# Patient Record
Sex: Female | Born: 1960
Health system: Southern US, Community
[De-identification: ages and names within clinical notes are randomized; demographics above are authoritative.]

## PROBLEM LIST (undated history)

## (undated) DIAGNOSIS — Z9289 Personal history of other medical treatment: Secondary | ICD-10-CM

## (undated) DIAGNOSIS — E785 Hyperlipidemia, unspecified: Secondary | ICD-10-CM

## (undated) DIAGNOSIS — R7303 Prediabetes: Secondary | ICD-10-CM

## (undated) DIAGNOSIS — R87619 Unspecified abnormal cytological findings in specimens from cervix uteri: Secondary | ICD-10-CM

## (undated) DIAGNOSIS — A6000 Herpesviral infection of urogenital system, unspecified: Secondary | ICD-10-CM

## (undated) DIAGNOSIS — I10 Essential (primary) hypertension: Secondary | ICD-10-CM

## (undated) DIAGNOSIS — R7301 Impaired fasting glucose: Secondary | ICD-10-CM

## (undated) HISTORY — DX: Prediabetes: R73.03

## (undated) HISTORY — DX: Unspecified abnormal cytological findings in specimens from cervix uteri: R87.619

## (undated) HISTORY — PX: DILATION AND CURETTAGE OF UTERUS: SHX78

## (undated) HISTORY — DX: Impaired fasting glucose: R73.01

## (undated) HISTORY — DX: Essential (primary) hypertension: I10

## (undated) HISTORY — DX: Herpesviral infection of urogenital system, unspecified: A60.00

## (undated) HISTORY — DX: Personal history of other medical treatment: Z92.89

## (undated) HISTORY — DX: Hyperlipidemia, unspecified: E78.5

---

## 2004-02-26 ENCOUNTER — Ambulatory Visit: Payer: Self-pay | Admitting: Unknown Physician Specialty

## 2004-08-06 ENCOUNTER — Ambulatory Visit: Payer: Self-pay | Admitting: Gynecologic Oncology

## 2005-03-03 ENCOUNTER — Ambulatory Visit: Payer: Self-pay | Admitting: Unknown Physician Specialty

## 2006-04-09 ENCOUNTER — Ambulatory Visit: Payer: Self-pay | Admitting: Unknown Physician Specialty

## 2007-04-28 ENCOUNTER — Ambulatory Visit: Payer: Self-pay | Admitting: Unknown Physician Specialty

## 2007-05-04 ENCOUNTER — Ambulatory Visit: Payer: Self-pay | Admitting: Unknown Physician Specialty

## 2007-11-16 ENCOUNTER — Ambulatory Visit: Payer: Self-pay | Admitting: Unknown Physician Specialty

## 2008-10-18 ENCOUNTER — Ambulatory Visit: Payer: Self-pay | Admitting: Obstetrics and Gynecology

## 2009-10-23 ENCOUNTER — Ambulatory Visit: Payer: Self-pay | Admitting: Internal Medicine

## 2011-02-19 ENCOUNTER — Telehealth: Payer: Self-pay | Admitting: Internal Medicine

## 2011-02-19 NOTE — Telephone Encounter (Signed)
No, she can just schedule

## 2011-02-19 NOTE — Telephone Encounter (Signed)
Pt received letter from [facility] saying its time for her mammogram does she need an appointment here prior to ordering her mammogram [phone]

## 2011-02-19 NOTE — Telephone Encounter (Signed)
Left message for pt to call office

## 2011-02-19 NOTE — Telephone Encounter (Signed)
Spoke to pt she is going to make her appointment at [facility]

## 2011-03-31 ENCOUNTER — Ambulatory Visit: Payer: Self-pay | Admitting: Internal Medicine

## 2011-06-19 ENCOUNTER — Encounter: Payer: Self-pay | Admitting: Internal Medicine

## 2011-06-24 ENCOUNTER — Encounter: Payer: Self-pay | Admitting: Internal Medicine

## 2011-06-24 ENCOUNTER — Ambulatory Visit (INDEPENDENT_AMBULATORY_CARE_PROVIDER_SITE_OTHER): Payer: PRIVATE HEALTH INSURANCE | Admitting: Internal Medicine

## 2011-06-24 ENCOUNTER — Other Ambulatory Visit (HOSPITAL_COMMUNITY)
Admission: RE | Admit: 2011-06-24 | Discharge: 2011-06-24 | Disposition: A | Discharge status: Home / Self Care | Payer: PRIVATE HEALTH INSURANCE | Source: Ambulatory Visit | Attending: Internal Medicine | Admitting: Internal Medicine

## 2011-06-24 VITALS — BP 136/82 | HR 95 | Temp 98.4°F | Ht 65.5 in | Wt 181.0 lb

## 2011-06-24 DIAGNOSIS — Z0000 Encounter for general adult medical examination without abnormal findings: Secondary | ICD-10-CM

## 2011-06-24 DIAGNOSIS — Z124 Encounter for screening for malignant neoplasm of cervix: Secondary | ICD-10-CM

## 2011-06-24 DIAGNOSIS — Z01419 Encounter for gynecological examination (general) (routine) without abnormal findings: Secondary | ICD-10-CM | POA: Insufficient documentation

## 2011-06-24 DIAGNOSIS — J3489 Other specified disorders of nose and nasal sinuses: Secondary | ICD-10-CM

## 2011-06-24 DIAGNOSIS — Z1159 Encounter for screening for other viral diseases: Secondary | ICD-10-CM | POA: Insufficient documentation

## 2011-06-24 DIAGNOSIS — Z1211 Encounter for screening for malignant neoplasm of colon: Secondary | ICD-10-CM

## 2011-06-24 DIAGNOSIS — R0981 Nasal congestion: Secondary | ICD-10-CM

## 2011-06-24 DIAGNOSIS — R413 Other amnesia: Secondary | ICD-10-CM

## 2011-06-24 DIAGNOSIS — R41840 Attention and concentration deficit: Secondary | ICD-10-CM

## 2011-06-24 LAB — COMPREHENSIVE METABOLIC PANEL
AST: 21 U/L (ref 0–37)
Albumin: 4.1 g/dL (ref 3.5–5.2)
Alkaline Phosphatase: 45 U/L (ref 39–117)
BUN: 13 mg/dL (ref 6–23)
Calcium: 9.1 mg/dL (ref 8.4–10.5)
Chloride: 103 mEq/L (ref 96–112)
Glucose, Bld: 97 mg/dL (ref 70–99)
Potassium: 3.7 mEq/L (ref 3.5–5.1)
Sodium: 139 mEq/L (ref 135–145)
Total Protein: 7.3 g/dL (ref 6.0–8.3)

## 2011-06-24 LAB — FOLLICLE STIMULATING HORMONE: FSH: 15.9 m[IU]/mL

## 2011-06-24 LAB — LIPID PANEL
Cholesterol: 248 mg/dL — ABNORMAL HIGH (ref 0–200)
Total CHOL/HDL Ratio: 6

## 2011-06-24 LAB — TSH: TSH: 1.39 u[IU]/mL (ref 0.35–5.50)

## 2011-06-24 LAB — LUTEINIZING HORMONE: LH: 10.95 m[IU]/mL

## 2011-06-24 MED ORDER — GUAIFENESIN ER 600 MG PO TB12
1200.0000 mg | ORAL_TABLET | Freq: Two times a day (BID) | ORAL | Status: DC
Start: 2011-06-24 — End: 2011-06-26

## 2011-06-24 NOTE — Progress Notes (Signed)
Subjective:    Patient ID: Brittney Park, female    DOB: 1961-04-19, 51 y.o.   MRN: [id number]  HPI 51 year old female presents for her annual exam. She reports that she is generally doing well. She is up-to-date on her health maintenance with the exception of her colonoscopy. She would like to schedule that today. She is also due for Pap. She is up-to-date on mammogram. She reports healthy diet and regular physical activity. She is not a smoker. She is up-to-date on her vaccinations.  She is concerned today about some recent difficulty with concentration. She notes that she sometimes has trouble focusing. She is interested in being evaluated for ADD. She was never diagnosed with this as a child. She did well in school. She notes, however, that she always had trouble focusing. She has never taken medication for this.  Outpatient Encounter Prescriptions as of 06/24/2011  Medication Sig Dispense Refill  . cholecalciferol (VITAMIN D) 1000 UNITS tablet Take 1,000 Units by mouth daily.      . fish oil-omega-3 fatty acids 1000 MG capsule Take 2 g by mouth daily.      . Multiple Vitamins-Minerals (MULTIVITAMIN WITH MINERALS) tablet Take 1 tablet by mouth daily.      . norethindrone-ethinyl estradiol 1/35 (ORTHO-NOVUM, NORTREL,CYCLAFEM) tablet Take 1 tablet by mouth daily.      Marland Kitchen guaiFENesin (MUCINEX) 600 MG 12 hr tablet Take 2 tablets (1,200 mg total) by mouth 2 (two) times daily.  30 tablet  0    Review of Systems  Constitutional: Negative for fever, chills, appetite change, fatigue and unexpected weight change.  HENT: Negative for ear pain, congestion, sore throat, trouble swallowing, neck pain, voice change and sinus pressure.   Eyes: Negative for visual disturbance.  Respiratory: Negative for cough, shortness of breath, wheezing and stridor.   Cardiovascular: Negative for chest pain, palpitations and leg swelling.  Gastrointestinal: Negative for nausea, vomiting, abdominal pain, diarrhea,  constipation, blood in stool, abdominal distention and anal bleeding.  Genitourinary: Negative for dysuria and flank pain.  Musculoskeletal: Negative for myalgias, arthralgias and gait problem.  Skin: Negative for color change and rash.  Neurological: Negative for dizziness and headaches.  Hematological: Negative for adenopathy. Does not bruise/bleed easily.  Psychiatric/Behavioral: Positive for decreased concentration. Negative for suicidal ideas, sleep disturbance and dysphoric mood. The patient is not nervous/anxious.    BP 136/82  Pulse 95  Temp(Src) 98.4 F (36.9 C) (Oral)  Ht 5' 5.5" (1.664 m)  Wt 181 lb (82.101 kg)  BMI 29.66 kg/m2  SpO2 98%     Objective:   Physical Exam  Constitutional: She is oriented to person, place, and time. She appears well-developed and well-nourished. No distress.  HENT:  Head: Normocephalic and atraumatic.  Right Ear: External ear normal.  Left Ear: External ear normal.  Nose: Nose normal.  Mouth/Throat: Oropharynx is clear and moist. No oropharyngeal exudate.  Eyes: Conjunctivae are normal. Pupils are equal, round, and reactive to light. Right eye exhibits no discharge. Left eye exhibits no discharge. No scleral icterus.  Neck: Normal range of motion. Neck supple. No tracheal deviation present. No thyromegaly present.  Cardiovascular: Normal rate, regular rhythm, normal heart sounds and intact distal pulses.  Exam reveals no gallop and no friction rub.   No murmur heard. Pulmonary/Chest: Effort normal and breath sounds normal. No respiratory distress. She has no wheezes. She has no rales. She exhibits no tenderness. Right breast exhibits no inverted nipple, no mass, no nipple discharge, no skin change and no  tenderness. Left breast exhibits no inverted nipple, no mass, no nipple discharge, no skin change and no tenderness. Breasts are symmetrical.  Abdominal: Soft. Bowel sounds are normal. She exhibits no distension and no mass. There is no  tenderness. There is no rebound and no guarding.  Genitourinary: Vagina normal and uterus normal.    No breast swelling, tenderness, discharge or bleeding. Pelvic exam was performed with patient prone. There is no rash, tenderness or lesion on the right labia. There is no rash, tenderness or lesion on the left labia. Uterus is not enlarged and not tender. Cervix exhibits no motion tenderness, no discharge and no friability. Right adnexum displays no mass, no tenderness and no fullness. Left adnexum displays no mass, no tenderness and no fullness. No erythema or tenderness around the vagina. No vaginal discharge found.  Musculoskeletal: Normal range of motion. She exhibits no edema and no tenderness.  Lymphadenopathy:    She has no cervical adenopathy.  Neurological: She is alert and oriented to person, place, and time. No cranial nerve deficit. She exhibits normal muscle tone. Coordination normal.  Skin: Skin is warm and dry. No rash noted. She is not diaphoretic. No erythema. No pallor.  Psychiatric: She has a normal mood and affect. Her behavior is normal. Judgment and thought content normal.          Assessment & Plan:

## 2011-06-24 NOTE — Assessment & Plan Note (Addendum)
Pt is UTD on health maintenance. PAP today. Colonoscopy scheduled. Labs including CBC, CMP, lipids. Follow up 1 month.

## 2011-06-24 NOTE — Assessment & Plan Note (Signed)
Pt reports trouble with concentration at work and home. Will set up evaluation for ADD with Dr. Letta Moynahan in psychology.

## 2011-06-25 ENCOUNTER — Telehealth: Payer: Self-pay | Admitting: *Deleted

## 2011-06-25 LAB — CBC WITH DIFFERENTIAL/PLATELET
Basophils Absolute: 0.1 10*3/uL (ref 0.0–0.1)
Basophils Relative: 1 % (ref 0–1)
HCT: 42.9 % (ref 36.0–46.0)
Lymphocytes Relative: 23 % (ref 12–46)
MCHC: 33.6 g/dL (ref 30.0–36.0)
Monocytes Absolute: 0.8 10*3/uL (ref 0.1–1.0)
Neutro Abs: 4.6 10*3/uL (ref 1.7–7.7)
Neutrophils Relative %: 62 % (ref 43–77)
RDW: 13 % (ref 11.5–15.5)
WBC: 7.4 10*3/uL (ref 4.0–10.5)

## 2011-06-25 LAB — LDL CHOLESTEROL, DIRECT: Direct LDL: 157.7 mg/dL

## 2011-06-25 MED ORDER — ATORVASTATIN CALCIUM 20 MG PO TABS: 20.0000 mg | ORAL_TABLET | Freq: Every day | ORAL | Status: DC

## 2011-06-25 NOTE — Telephone Encounter (Signed)
Message copied by Vernie Murders on Thu Jun 25, 2011  4:19 PM ------      Message from: Ronna Polio A      Created: Thu Jun 25, 2011 11:55 AM       Labs show that cholesterol is high. Would recommend starting Lipitor 20mg  po daily. Repeat lipids and LFTs in 50month.

## 2011-06-26 ENCOUNTER — Encounter: Payer: Self-pay | Admitting: Internal Medicine

## 2011-06-26 ENCOUNTER — Other Ambulatory Visit: Payer: Self-pay | Admitting: *Deleted

## 2011-06-26 DIAGNOSIS — R0981 Nasal congestion: Secondary | ICD-10-CM

## 2011-06-26 MED ORDER — GUAIFENESIN ER 600 MG PO TB12: 1200.0000 mg | ORAL_TABLET | Freq: Two times a day (BID) | ORAL | Status: AC

## 2011-06-29 ENCOUNTER — Encounter: Payer: Self-pay | Admitting: *Deleted

## 2011-07-30 ENCOUNTER — Encounter: Payer: Self-pay | Admitting: Internal Medicine

## 2011-07-31 ENCOUNTER — Encounter: Payer: Self-pay | Admitting: Internal Medicine

## 2011-09-22 ENCOUNTER — Ambulatory Visit: Payer: Self-pay | Admitting: Unknown Physician Specialty

## 2011-09-23 LAB — PATHOLOGY REPORT

## 2011-11-03 ENCOUNTER — Other Ambulatory Visit: Payer: Self-pay | Admitting: Internal Medicine

## 2011-11-03 MED ORDER — NORETHINDRONE-ETH ESTRADIOL 1-35 MG-MCG PO TABS: 1.0000 | ORAL_TABLET | Freq: Every day | ORAL | Status: DC

## 2011-11-03 NOTE — Telephone Encounter (Signed)
Refill [facility] fax # [phone].

## 2011-11-04 ENCOUNTER — Other Ambulatory Visit: Payer: Self-pay | Admitting: Internal Medicine

## 2011-11-04 MED ORDER — NORETHINDRONE-ETH ESTRADIOL 1-35 MG-MCG PO TABS: 1.0000 | ORAL_TABLET | Freq: Every day | ORAL | Status: DC

## 2011-11-05 NOTE — Telephone Encounter (Signed)
Faxed to Pharmacy health

## 2011-11-09 ENCOUNTER — Other Ambulatory Visit: Payer: Self-pay | Admitting: *Deleted

## 2011-11-09 MED ORDER — NORETHINDRONE-ETH ESTRADIOL 1-35 MG-MCG PO TABS: 1.0000 | ORAL_TABLET | Freq: Every day | ORAL | Status: DC

## 2011-11-18 ENCOUNTER — Encounter: Payer: Self-pay | Admitting: Internal Medicine

## 2011-11-18 ENCOUNTER — Other Ambulatory Visit: Payer: Self-pay | Admitting: *Deleted

## 2011-11-18 MED ORDER — NORETHINDRONE-ETH ESTRADIOL 1-35 MG-MCG PO TABS: 1.0000 | ORAL_TABLET | Freq: Every day | ORAL | Status: DC

## 2012-03-14 ENCOUNTER — Encounter: Payer: Self-pay | Admitting: Internal Medicine

## 2012-03-14 DIAGNOSIS — Z1239 Encounter for other screening for malignant neoplasm of breast: Secondary | ICD-10-CM

## 2012-03-15 NOTE — Telephone Encounter (Signed)
Vanessa,  pls schedule.  Order on printer

## 2012-04-05 ENCOUNTER — Ambulatory Visit: Payer: Self-pay | Admitting: Internal Medicine

## 2012-04-22 ENCOUNTER — Encounter: Payer: Self-pay | Admitting: Internal Medicine

## 2012-06-22 ENCOUNTER — Telehealth: Payer: Self-pay | Admitting: Internal Medicine

## 2012-06-22 ENCOUNTER — Encounter: Payer: Self-pay | Admitting: Internal Medicine

## 2012-06-22 ENCOUNTER — Ambulatory Visit (INDEPENDENT_AMBULATORY_CARE_PROVIDER_SITE_OTHER): Payer: BC Managed Care – PPO | Admitting: Internal Medicine

## 2012-06-22 VITALS — BP 124/80 | HR 90 | Temp 98.3°F | Ht 65.5 in | Wt 178.5 lb

## 2012-06-22 DIAGNOSIS — A084 Viral intestinal infection, unspecified: Secondary | ICD-10-CM

## 2012-06-22 DIAGNOSIS — R112 Nausea with vomiting, unspecified: Secondary | ICD-10-CM

## 2012-06-22 DIAGNOSIS — A088 Other specified intestinal infections: Secondary | ICD-10-CM

## 2012-06-22 MED ORDER — ONDANSETRON 8 MG PO TBDP: 8.0000 mg | ORAL_TABLET | Freq: Three times a day (TID) | ORAL | Status: DC | PRN

## 2012-06-22 NOTE — Assessment & Plan Note (Signed)
Symptoms seem most consistent with viral gastroenteritis. Rapid flu negative. Will treat supportively with increased clear fluids as tolerated, anti-emetics. Ondansetron 8mg  po bid prn called in.  Pt will use tylenol prn fever. Pt will call if symptoms worsening or unable to keep down liquids. She will email with update tomorrow.

## 2012-06-22 NOTE — Progress Notes (Signed)
Subjective:    Patient ID: Brittney Park, female    DOB: [birth year], 52 y.o.   MRN: [id number]  HPI 52YO female presents for acute visit. Complains of 1 day h/o subjective fever, chills, malaise, sore throat, headache, non-productive cough, nausea. Had one episode of vomiting this morning, non-bloody, non-bilious. Notes some loose stools today and yesterday. Denies abdominal pain. Denies shortness of breath or chest pain. Tolerated liquids this morning and 1 slice of toast. Has taken tylenol with no improvement in symptoms. Husband was sick with URI symptoms last week, but no nausea/vomiting.  Outpatient Encounter Prescriptions as of 06/22/2012  Medication Sig Dispense Refill  . cholecalciferol (VITAMIN D) 1000 UNITS tablet Take one by mouth once a week      . fish oil-omega-3 fatty acids 1000 MG capsule Take one by mouth twice a week      . guaiFENesin (MUCINEX) 600 MG 12 hr tablet Take 2 tablets (1,200 mg total) by mouth 2 (two) times daily.  30 tablet  0  . Multiple Vitamins-Minerals (MULTIVITAMIN WITH MINERALS) tablet Take 1 tablet by mouth daily.      . norethindrone-ethinyl estradiol 1/35 (ORTHO-NOVUM, NORTREL,CYCLAFEM) tablet Take 1 tablet by mouth daily.  1 Package  12   BP 124/80  Pulse 90  Temp 98.3 F (36.8 C) (Oral)  Ht 5' 5.5" (1.664 m)  Wt 178 lb 8 oz (80.967 kg)  BMI 29.25 kg/m2  SpO2 97%  LMP 06/14/2012  Review of Systems  Constitutional: Positive for fever, chills and fatigue. Negative for unexpected weight change.  HENT: Positive for sore throat, rhinorrhea and postnasal drip. Negative for hearing loss, ear pain, nosebleeds, congestion, facial swelling, sneezing, mouth sores, trouble swallowing, neck pain, neck stiffness, voice change, sinus pressure, tinnitus and ear discharge.   Eyes: Negative for pain, discharge, redness and visual disturbance.  Respiratory: Positive for cough. Negative for chest tightness, shortness of breath, wheezing and stridor.   Cardiovascular:  Negative for chest pain, palpitations and leg swelling.  Gastrointestinal: Positive for nausea, vomiting and diarrhea. Negative for abdominal pain, blood in stool and abdominal distention.  Musculoskeletal: Negative for myalgias and arthralgias.  Skin: Negative for color change and rash.  Neurological: Negative for dizziness, weakness, light-headedness and headaches.  Hematological: Negative for adenopathy.       Objective:   Physical Exam  Constitutional: She is oriented to person, place, and time. She appears well-developed and well-nourished. She has a sickly appearance. No distress.  HENT:  Head: Normocephalic and atraumatic.  Right Ear: External ear normal. A middle ear effusion is present.  Left Ear: External ear normal. A middle ear effusion is present.  Nose: Nose normal.  Mouth/Throat: Posterior oropharyngeal erythema present. No oropharyngeal exudate.  Eyes: Conjunctivae normal are normal. Pupils are equal, round, and reactive to light. Right eye exhibits no discharge. Left eye exhibits no discharge. No scleral icterus.  Neck: Normal range of motion and full passive range of motion without pain. Neck supple. No tracheal deviation and normal range of motion present. No thyromegaly present.  Cardiovascular: Normal rate, regular rhythm, normal heart sounds and intact distal pulses.  Exam reveals no gallop and no friction rub.   No murmur heard. Pulmonary/Chest: Effort normal and breath sounds normal. No accessory muscle usage. Not tachypneic. No respiratory distress. She has no decreased breath sounds. She has no wheezes. She has no rhonchi. She has no rales. She exhibits no tenderness.  Abdominal: Soft. Bowel sounds are normal. She exhibits no distension. There is no tenderness.  Musculoskeletal: Normal range of motion. She exhibits no edema and no tenderness.  Lymphadenopathy:    She has no cervical adenopathy.  Neurological: She is alert and oriented to person, place, and time.  No cranial nerve deficit. She exhibits normal muscle tone. Coordination normal.  Skin: Skin is warm and dry. No rash noted. She is not diaphoretic. No erythema. No pallor.  Psychiatric: She has a normal mood and affect. Her behavior is normal. Judgment and thought content normal.          Assessment & Plan:

## 2012-06-22 NOTE — Telephone Encounter (Signed)
Patient Information:  Caller Name: Hailly  Phone: [phone]  Patient: Brittney Park, Brittney Park  Gender: Female  DOB: [birth year]  Age: 52 Years  PCP: Ronna Polio (Adults only)  Pregnant: No  Office Follow Up:  Does the office need to follow up with this patient?: No  Instructions For The Office: N/A  RN Note:  Cough, sore throat, headache, nasal drainage-clear, feels nauseated has had a fever earlier, but currently does not know what it is.  Denies contact with person that tested positive for flu - husband was sick last week and several have been sick around her, but not sure if they tested or not but was thought to be the flu.  Home remedies are not working.  Does not meet standing order criteria for flu but is willing to come in.  Appointment scheduled with Dr. Dan Humphreys at 14:45- patient not able to make open 14:30 appointment.     Symptoms  Reason For Call & Symptoms: Headache, scratchy throat, fever, vomited x  Reviewed Health History In EMR: Yes  Reviewed Medications In EMR: Yes  Reviewed Allergies In EMR: Yes  Reviewed Surgeries / Procedures: Yes  Date of Onset of Symptoms: 06/21/2012  Treatments Tried: tylenol  Treatments Tried Worked: No  Any Fever: Yes  Fever Taken: Oral  Fever Time Of Reading: 10:30:00  Fever Last Reading: 101 OB / GYN:  LMP: 06/14/2012  Guideline(s) Used:  Influenza - Seasonal  Disposition Per Guideline:   Discuss with PCP and Callback by Nurse Today  Reason For Disposition Reached:   Patient requests antiviral medicine for influenza and flu symptoms present < 48 hours  Advice Given: Instructed to be careful in transit.  RN Overrode Recommendation:  Make Appointment  Patient did meet criteria for Tamiflu call in, but is willing to come in for testing.  Appointment Scheduled:  06/22/2012 14:45:00 Appointment Scheduled Provider:  Ronna Polio (Adults only)

## 2012-06-23 ENCOUNTER — Encounter: Payer: Self-pay | Admitting: Internal Medicine

## 2012-07-09 ENCOUNTER — Other Ambulatory Visit: Payer: Self-pay

## 2012-10-03 ENCOUNTER — Encounter: Payer: BC Managed Care – PPO | Admitting: Internal Medicine

## 2012-11-03 ENCOUNTER — Other Ambulatory Visit: Payer: Self-pay | Admitting: Internal Medicine

## 2013-01-05 ENCOUNTER — Ambulatory Visit (INDEPENDENT_AMBULATORY_CARE_PROVIDER_SITE_OTHER): Payer: BC Managed Care – PPO | Admitting: Internal Medicine

## 2013-01-05 ENCOUNTER — Encounter: Payer: Self-pay | Admitting: Internal Medicine

## 2013-01-05 VITALS — BP 120/80 | HR 86 | Temp 98.8°F | Ht 66.0 in | Wt 181.0 lb

## 2013-01-05 DIAGNOSIS — E663 Overweight: Secondary | ICD-10-CM | POA: Insufficient documentation

## 2013-01-05 DIAGNOSIS — Z6825 Body mass index (BMI) 25.0-25.9, adult: Secondary | ICD-10-CM

## 2013-01-05 DIAGNOSIS — E781 Pure hyperglyceridemia: Secondary | ICD-10-CM | POA: Insufficient documentation

## 2013-01-05 DIAGNOSIS — Z0000 Encounter for general adult medical examination without abnormal findings: Secondary | ICD-10-CM

## 2013-01-05 LAB — LIPID PANEL
Cholesterol: 214 mg/dL — ABNORMAL HIGH (ref 0–200)
Total CHOL/HDL Ratio: 6

## 2013-01-05 LAB — COMPREHENSIVE METABOLIC PANEL
Albumin: 3.7 g/dL (ref 3.5–5.2)
Alkaline Phosphatase: 42 U/L (ref 39–117)
BUN: 11 mg/dL (ref 6–23)
Calcium: 8.7 mg/dL (ref 8.4–10.5)
Chloride: 103 mEq/L (ref 96–112)
Glucose, Bld: 90 mg/dL (ref 70–99)
Potassium: 5.3 mEq/L — ABNORMAL HIGH (ref 3.5–5.1)
Sodium: 136 mEq/L (ref 135–145)
Total Protein: 6.4 g/dL (ref 6.0–8.3)

## 2013-01-05 LAB — CBC WITH DIFFERENTIAL/PLATELET
Eosinophils Relative: 5.9 % — ABNORMAL HIGH (ref 0.0–5.0)
HCT: 38.8 % (ref 36.0–46.0)
Hemoglobin: 13.3 g/dL (ref 12.0–15.0)
Lymphs Abs: 2.4 10*3/uL (ref 0.7–4.0)
MCV: 88.5 fl (ref 78.0–100.0)
Monocytes Absolute: 0.8 10*3/uL (ref 0.1–1.0)
Monocytes Relative: 7 % (ref 3.0–12.0)
Neutro Abs: 7 10*3/uL (ref 1.4–7.7)
RDW: 13.5 % (ref 11.5–14.6)
WBC: 10.9 10*3/uL — ABNORMAL HIGH (ref 4.5–10.5)

## 2013-01-05 LAB — LDL CHOLESTEROL, DIRECT: Direct LDL: 153 mg/dL

## 2013-01-05 LAB — MICROALBUMIN / CREATININE URINE RATIO
Creatinine,U: 180.9 mg/dL
Microalb, Ur: 0.5 mg/dL (ref 0.0–1.9)

## 2013-01-05 MED ORDER — NORETHINDRONE-ETH ESTRADIOL 1-35 MG-MCG PO TABS: ORAL_TABLET | ORAL | Status: DC

## 2013-01-05 NOTE — Assessment & Plan Note (Signed)
Encouraged healthy diet high in fiber and low in processed carbohydrates and saturated fat. Encouraged regular physical activity with goal of 40 minutes 3 times per week. 

## 2013-01-05 NOTE — Assessment & Plan Note (Addendum)
General medical exam normal today including breast exam. Pap and pelvic exam deferred as these were normal in 2013. Mammogram will be scheduled this fall. Immunizations are up-to-date. Will check labs today including CBC, CMP, lipid profile. Encouraged healthy diet high in fiber and low in processed carbohydrates and saturated fat. Encouraged regular physical activity with goal of 40 minutes 3 times per week.

## 2013-01-05 NOTE — Assessment & Plan Note (Signed)
Noted on previous labs from 2013. Pt declined use of Atorvastatin. Will repeat lipids and LFTs with labs today. Discussed new cholesterol guidelines including recommendation of Mediterranean style diet and regular aerobic activity with a goal 40 minutes 3 times per week.

## 2013-01-05 NOTE — Progress Notes (Signed)
Subjective:    Patient ID: Brittney Park, female    DOB: 1960-06-02, 52 y.o.   MRN: [id number]  HPI 51 year old female presents for annual exam. She reports she is generally feeling well. She has been trying to follow a healthier diet and get regular physical activity by walking. She is concerned about her weight. She notes that her close are fitting more tightly. At her last visit, one year ago, she was noted to have elevated cholesterol. It was recommended that she start Lipitor. She reports she discussed this with her pharmacist recommended against starting Lipitor. She decided to try to improve cholesterol with her diet alone.  Outpatient Encounter Prescriptions as of 01/05/2013  Medication Sig Dispense Refill  . cholecalciferol (VITAMIN D) 1000 UNITS tablet Take one by mouth once a week      . fish oil-omega-3 fatty acids 1000 MG capsule Take one by mouth twice a week      . Multiple Vitamins-Minerals (MULTIVITAMIN WITH MINERALS) tablet Take 1 tablet by mouth daily.      . norethindrone-ethinyl estradiol 1/35 (NORTREL 1/35, 28,) tablet TAKE ONE TABLET BY MOUTH ONE TIME DAILY  3 Package  4   No facility-administered encounter medications on file as of 01/05/2013.   BP 120/80  Pulse 86  Temp(Src) 98.8 F (37.1 C) (Oral)  Ht 5\' 6"  (1.676 m)  Wt 181 lb (82.101 kg)  BMI 29.23 kg/m2  SpO2 97%  LMP 12/29/2012  Review of Systems  Constitutional: Negative for fever, chills, appetite change, fatigue and unexpected weight change.  HENT: Negative for ear pain, congestion, sore throat, trouble swallowing, neck pain, voice change and sinus pressure.   Eyes: Negative for visual disturbance.  Respiratory: Negative for cough, shortness of breath, wheezing and stridor.   Cardiovascular: Negative for chest pain, palpitations and leg swelling.  Gastrointestinal: Negative for nausea, vomiting, abdominal pain, diarrhea, constipation, blood in stool, abdominal distention and anal bleeding.  Genitourinary:  Negative for dysuria and flank pain.  Musculoskeletal: Negative for myalgias, arthralgias and gait problem.  Skin: Negative for color change and rash.  Neurological: Negative for dizziness and headaches.  Hematological: Negative for adenopathy. Does not bruise/bleed easily.  Psychiatric/Behavioral: Negative for suicidal ideas, sleep disturbance and dysphoric mood. The patient is not nervous/anxious.        Objective:   Physical Exam  Constitutional: She is oriented to person, place, and time. She appears well-developed and well-nourished. No distress.  HENT:  Head: Normocephalic and atraumatic.  Right Ear: External ear normal.  Left Ear: External ear normal.  Nose: Nose normal.  Mouth/Throat: Oropharynx is clear and moist. No oropharyngeal exudate.  Eyes: Conjunctivae are normal. Pupils are equal, round, and reactive to light. Right eye exhibits no discharge. Left eye exhibits no discharge. No scleral icterus.  Neck: Normal range of motion. Neck supple. No tracheal deviation present. No thyromegaly present.  Cardiovascular: Normal rate, regular rhythm, normal heart sounds and intact distal pulses.  Exam reveals no gallop and no friction rub.   No murmur heard. Pulmonary/Chest: Effort normal and breath sounds normal. No accessory muscle usage. Not tachypneic. No respiratory distress. She has no decreased breath sounds. She has no wheezes. She has no rhonchi. She has no rales. She exhibits no tenderness. Right breast exhibits no inverted nipple, no mass, no nipple discharge, no skin change and no tenderness. Left breast exhibits no inverted nipple, no mass, no nipple discharge, no skin change and no tenderness. Breasts are symmetrical.  Abdominal: Soft. Bowel sounds are normal.  She exhibits no distension and no mass. There is no tenderness. There is no rebound and no guarding.  Musculoskeletal: Normal range of motion. She exhibits no edema and no tenderness.  Lymphadenopathy:    She has no  cervical adenopathy.  Neurological: She is alert and oriented to person, place, and time. No cranial nerve deficit. She exhibits normal muscle tone. Coordination normal.  Skin: Skin is warm and dry. No rash noted. She is not diaphoretic. No erythema. No pallor.  Psychiatric: She has a normal mood and affect. Her behavior is normal. Judgment and thought content normal.          Assessment & Plan:

## 2013-01-06 LAB — VITAMIN D 25 HYDROXY (VIT D DEFICIENCY, FRACTURES): Vit D, 25-Hydroxy: 38 ng/mL (ref 30–89)

## 2013-03-30 ENCOUNTER — Other Ambulatory Visit: Payer: Self-pay

## 2013-11-22 ENCOUNTER — Encounter: Payer: Self-pay | Admitting: Internal Medicine

## 2013-12-28 ENCOUNTER — Ambulatory Visit: Payer: Self-pay | Admitting: Internal Medicine

## 2013-12-28 ENCOUNTER — Encounter: Payer: Self-pay | Admitting: *Deleted

## 2013-12-28 LAB — HM MAMMOGRAPHY: HM Mammogram: NEGATIVE

## 2014-01-08 ENCOUNTER — Encounter: Payer: BC Managed Care – PPO | Admitting: Internal Medicine

## 2014-01-19 ENCOUNTER — Encounter: Payer: Self-pay | Admitting: Internal Medicine

## 2014-01-23 DIAGNOSIS — Z9289 Personal history of other medical treatment: Secondary | ICD-10-CM

## 2014-01-23 HISTORY — DX: Personal history of other medical treatment: Z92.89

## 2014-01-23 LAB — HM PAP SMEAR

## 2014-01-25 DIAGNOSIS — R7301 Impaired fasting glucose: Secondary | ICD-10-CM

## 2014-01-25 HISTORY — DX: Impaired fasting glucose: R73.01

## 2014-02-22 DIAGNOSIS — R7303 Prediabetes: Secondary | ICD-10-CM

## 2014-02-22 HISTORY — DX: Prediabetes: R73.03

## 2015-03-25 ENCOUNTER — Other Ambulatory Visit: Payer: Self-pay | Admitting: *Deleted

## 2015-03-25 ENCOUNTER — Other Ambulatory Visit: Payer: Self-pay | Admitting: Obstetrics and Gynecology

## 2015-03-25 ENCOUNTER — Inpatient Hospital Stay
Admission: RE | Admit: 2015-03-25 | Discharge: 2015-03-25 | Disposition: A | Discharge status: Home / Self Care | Payer: Self-pay | Source: Ambulatory Visit | Attending: *Deleted | Admitting: *Deleted

## 2015-03-25 DIAGNOSIS — Z9289 Personal history of other medical treatment: Secondary | ICD-10-CM

## 2015-03-25 DIAGNOSIS — R928 Other abnormal and inconclusive findings on diagnostic imaging of breast: Secondary | ICD-10-CM

## 2015-04-12 ENCOUNTER — Ambulatory Visit: Payer: Self-pay

## 2015-04-12 ENCOUNTER — Other Ambulatory Visit: Payer: Self-pay

## 2015-10-25 LAB — HM MAMMOGRAPHY: HM MAMMO: ABNORMAL — AB (ref 0–4)

## 2016-03-02 ENCOUNTER — Other Ambulatory Visit: Payer: Self-pay | Admitting: Obstetrics and Gynecology

## 2016-03-02 DIAGNOSIS — R928 Other abnormal and inconclusive findings on diagnostic imaging of breast: Secondary | ICD-10-CM

## 2016-03-02 DIAGNOSIS — N6489 Other specified disorders of breast: Secondary | ICD-10-CM

## 2016-03-05 ENCOUNTER — Inpatient Hospital Stay
Admission: RE | Admit: 2016-03-05 | Discharge: 2016-03-05 | Disposition: A | Discharge status: Home / Self Care | Payer: Self-pay | Source: Ambulatory Visit | Attending: *Deleted | Admitting: *Deleted

## 2016-03-05 ENCOUNTER — Other Ambulatory Visit: Payer: Self-pay | Admitting: *Deleted

## 2016-03-05 DIAGNOSIS — Z9289 Personal history of other medical treatment: Secondary | ICD-10-CM

## 2016-04-28 ENCOUNTER — Other Ambulatory Visit: Payer: Self-pay

## 2016-04-28 ENCOUNTER — Ambulatory Visit: Payer: Self-pay

## 2016-04-30 ENCOUNTER — Ambulatory Visit
Admission: RE | Admit: 2016-04-30 | Discharge: 2016-04-30 | Disposition: A | Discharge status: Home / Self Care | Payer: 59 | Source: Ambulatory Visit | Attending: Obstetrics and Gynecology | Admitting: Obstetrics and Gynecology

## 2016-04-30 ENCOUNTER — Encounter: Payer: Self-pay | Admitting: Radiology

## 2016-04-30 DIAGNOSIS — R928 Other abnormal and inconclusive findings on diagnostic imaging of breast: Secondary | ICD-10-CM | POA: Diagnosis not present

## 2016-04-30 DIAGNOSIS — N6489 Other specified disorders of breast: Secondary | ICD-10-CM

## 2016-09-22 DIAGNOSIS — A6000 Herpesviral infection of urogenital system, unspecified: Secondary | ICD-10-CM | POA: Insufficient documentation

## 2016-09-22 HISTORY — DX: Herpesviral infection of urogenital system, unspecified: A60.00

## 2016-09-29 ENCOUNTER — Ambulatory Visit (INDEPENDENT_AMBULATORY_CARE_PROVIDER_SITE_OTHER): Payer: 59 | Admitting: Obstetrics and Gynecology

## 2016-09-29 ENCOUNTER — Encounter: Payer: Self-pay | Admitting: Obstetrics and Gynecology

## 2016-09-29 VITALS — BP 118/74 | Ht 66.0 in | Wt 140.0 lb

## 2016-09-29 DIAGNOSIS — N898 Other specified noninflammatory disorders of vagina: Secondary | ICD-10-CM | POA: Diagnosis not present

## 2016-09-29 NOTE — Progress Notes (Signed)
Chief Complaint  Patient presents with  . Vaginitis  . Plevic Exam    pt c/o feeling irritation, itching, cluster of blisters in genital area starting 2 weeks ago    HPI:      [name] is a 56 y.o. [id number] who LMP was Patient's last menstrual period was 12/29/2012., presents today for a hx of vaginal lesions and irritation that started about 2 wks ago. Pt noticed a cluster of blisters in the vulvar area that were tender/tingling/itchy. No pain. She also noticed a day or 2 of increased d/c and odor. She denies any fever, flu-like sx, hx of HSV. Sx have resolved now. She denies any new soaps/detergents.    Patient Active Problem List   Diagnosis Date Noted  . Routine general medical examination at a health care facility 01/05/2013  . Hypertriglyceridemia 01/05/2013  . Overweight (BMI 25.0-29.9) 01/05/2013    Family History  Problem Relation Age of Onset  . Cancer Mother     ovarian?  . Cancer Father     liver  . Retinal detachment Father   . Heart disease Maternal Grandfather   . Cancer Brother     MELANOMA    Social History   Social History  . Marital status: Married    Spouse name: N/A  . Number of children: 2  . Years of education: N/A   Occupational History  . [employer] - Director [employer]   Social History Main Topics  . Smoking status: Never Smoker  . Smokeless tobacco: Never Used  . Alcohol use Yes     Comment: Occasional  . Drug use: No  . Sexual activity: Yes    Birth control/ protection: Post-menopausal   Other Topics Concern  . Not on file   Social History Narrative   Lives in [place] with husband. No pets. Work - [employer]      Regular Exercise -  NO   Daily Caffeine Use:  2 cups coffee AM              Current Outpatient Prescriptions:  .  cholecalciferol (VITAMIN D) 1000 UNITS tablet, Take one by mouth once a week, Disp: , Rfl:  .  fish oil-omega-3 fatty acids 1000 MG capsule, Take  one by mouth twice a week, Disp: , Rfl:  .  Multiple Vitamins-Minerals (MULTIVITAMIN WITH MINERALS) tablet, Take 1 tablet by mouth daily., Disp: , Rfl:   Review of Systems  Constitutional: Negative for fever.  Gastrointestinal: Negative for blood in stool, constipation, diarrhea, nausea and vomiting.  Genitourinary: Positive for genital sores and vaginal pain. Negative for dyspareunia, dysuria, flank pain, frequency, hematuria, urgency, vaginal bleeding and vaginal discharge.  Musculoskeletal: Negative for back pain.  Skin: Negative for rash.     OBJECTIVE:   Vitals:  BP 118/74   Ht 5\' 6"  (1.676 m)   Wt 140 lb (63.5 kg)   LMP 12/29/2012   BMI 22.60 kg/m   Physical Exam  Constitutional: She is oriented to person, place, and time and well-developed, well-nourished, and in no distress. Vital signs are normal.  Genitourinary: Right adnexa normal. Vulva exhibits erythema and lesion. Vulva exhibits no exudate, no rash and no tenderness.  Genitourinary Comments: RESOLVED ERYTHEMATOUS PATCH LT PERIANAL AREA  Neurological: She is oriented to person, place, and time.  Vitals reviewed.   Assessment/Plan: Vaginal lesion - Resolved. Given sx hx, will check HSV 2 IgG. If neg, question HSV 1 lesion. Discussion re: etiology,  treatment, prevention. Questions answered.  - Plan: HSV 2 antibody, IgG    Return if symptoms worsen or fail to improve.  Alicia B. Copland, PA-C 09/29/2016 10:00 AM

## 2016-09-30 ENCOUNTER — Encounter: Payer: Self-pay | Admitting: Obstetrics and Gynecology

## 2016-09-30 DIAGNOSIS — E785 Hyperlipidemia, unspecified: Secondary | ICD-10-CM | POA: Insufficient documentation

## 2016-09-30 DIAGNOSIS — I10 Essential (primary) hypertension: Secondary | ICD-10-CM | POA: Insufficient documentation

## 2016-09-30 LAB — HSV 2 ANTIBODY, IGG: HSV 2 GLYCOPROTEIN G AB, IGG: 9.67 {index} — AB (ref 0.00–0.90)

## 2017-04-07 ENCOUNTER — Ambulatory Visit: Payer: 59 | Admitting: Obstetrics and Gynecology

## 2017-05-24 ENCOUNTER — Encounter: Payer: Self-pay | Admitting: Obstetrics and Gynecology

## 2017-05-24 ENCOUNTER — Ambulatory Visit (INDEPENDENT_AMBULATORY_CARE_PROVIDER_SITE_OTHER): Payer: 59 | Admitting: Obstetrics and Gynecology

## 2017-05-24 VITALS — BP 140/90 | HR 60 | Ht 66.0 in | Wt 148.0 lb

## 2017-05-24 DIAGNOSIS — Z1151 Encounter for screening for human papillomavirus (HPV): Secondary | ICD-10-CM | POA: Diagnosis not present

## 2017-05-24 DIAGNOSIS — Z1231 Encounter for screening mammogram for malignant neoplasm of breast: Secondary | ICD-10-CM | POA: Diagnosis not present

## 2017-05-24 DIAGNOSIS — Z1239 Encounter for other screening for malignant neoplasm of breast: Secondary | ICD-10-CM

## 2017-05-24 DIAGNOSIS — Z124 Encounter for screening for malignant neoplasm of cervix: Secondary | ICD-10-CM | POA: Diagnosis not present

## 2017-05-24 DIAGNOSIS — Z01419 Encounter for gynecological examination (general) (routine) without abnormal findings: Secondary | ICD-10-CM

## 2017-05-24 DIAGNOSIS — N63 Unspecified lump in unspecified breast: Secondary | ICD-10-CM | POA: Diagnosis not present

## 2017-05-24 DIAGNOSIS — Z1211 Encounter for screening for malignant neoplasm of colon: Secondary | ICD-10-CM | POA: Diagnosis not present

## 2017-05-24 NOTE — Progress Notes (Addendum)
PCP: Patient, No Pcp Per   Chief Complaint  Patient presents with  . Gynecologic Exam    HPI:      Brittney Park is a 56 y.o. [id number] who LMP was Patient's last menstrual period was 12/29/2012., presents today for her annual examination.  Her menses are absent and she is postmenopausal. She does not have intermenstrual bleeding.  She does not have vasomotor sx.   Sex activity: single partner, contraception - post menopausal status. She does have vaginal dryness. Uses lubricants with relief.   Last Pap: January 23, 2014  Results were: no abnormalities /neg HPV DNA.  Hx of STDs: HSV lesion 5/18, neg culture but pos HSV 2 IgG. No recent sx.   Last mammogram: April 30, 2016  Results were: normal--routine follow-up in 12 months. Stable RT breast mass so due for dx mammo bilat. There is no FH of breast cancer. There is no FH of ovarian cancer. The patient does not do self-breast exams.  Colonoscopy: colonoscopy 5 years ago without abnormalities.  Repeat due after 5 years.   Tobacco use: The patient denies current or previous tobacco use. Alcohol use: none Exercise: not active  She does get adequate calcium and Vitamin D in her diet.  Lipids good last yr. LDL slightly elevated at 120. Wants lab check next yr at annual.   Past Medical History:  Diagnosis Date  . Abnormal fasting glucose 01/25/2014  . Abnormal Pap smear of cervix mid 90s   D&C  . Genital herpes 09/2016   type 2 by IgG  . History of Papanicolaou smear of cervix 01/23/2014   -/-  . HTN (hypertension)   . Hyperlipidemia   . Pre-diabetes 02/2014  . Vitamin D deficiency     Past Surgical History:  Procedure Laterality Date  . DILATION AND CURETTAGE OF UTERUS  MID 90S  . VAGINAL DELIVERY     x2    Family History  Problem Relation Age of Onset  . Cancer Mother        ovarian?  . Cancer Father        liver  . Retinal detachment Father   . Heart disease Maternal Grandfather   . Cancer Brother       MELANOMA    Social History   Socioeconomic History  . Marital status: Married    Spouse name: Not on file  . Number of children: 2  . Years of education: Not on file  . Highest education level: Not on file  Social Needs  . Financial resource strain: Not on file  . Food insecurity - worry: Not on file  . Food insecurity - inability: Not on file  . Transportation needs - medical: Not on file  . Transportation needs - non-medical: Not on file  Occupational History  . 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 Smoking status: Never Smoker  . Smokeless tobacco: Never Used  Substance and Sexual Activity  . Alcohol use: Yes    Comment: Occasional  . Drug use: No  . Sexual activity: Yes    Birth control/protection: Post-menopausal  Other Topics Concern  . Not on file  Social History Narrative   [place]. No pets. Work - [employer]      Regular Exercise -  NO   Daily Caffeine Use:  2 cups coffee AM  Current Meds  Medication Sig  . cholecalciferol (VITAMIN D) 1000 UNITS tablet Take one by mouth once a week  . Multiple Vitamins-Minerals (MULTIVITAMIN WITH MINERALS) tablet Take 1 tablet by mouth daily.      ROS:  Review of Systems  Constitutional: Negative for fatigue, fever and unexpected weight change.  Respiratory: Negative for cough, shortness of breath and wheezing.   Cardiovascular: Negative for chest pain, palpitations and leg swelling.  Gastrointestinal: Negative for blood in stool, constipation, diarrhea, nausea and vomiting.  Endocrine: Negative for cold intolerance, heat intolerance and polyuria.  Genitourinary: Negative for dyspareunia, dysuria, flank pain, frequency, genital sores, hematuria, menstrual problem, pelvic pain, urgency, vaginal bleeding, vaginal discharge and vaginal pain.  Musculoskeletal: Negative for back pain, joint swelling and  myalgias.  Skin: Negative for rash.  Neurological: Negative for dizziness, syncope, light-headedness, numbness and headaches.  Hematological: Negative for adenopathy.  Psychiatric/Behavioral: Negative for agitation, confusion, sleep disturbance and suicidal ideas. The patient is not nervous/anxious.      Objective: BP 140/90   Pulse 60   Ht 5\' 6"  (1.676 m)   Wt 148 lb (67.1 kg)   LMP 12/29/2012   BMI 23.89 kg/m    Physical Exam  Constitutional: She is oriented to person, place, and time. She appears well-developed and well-nourished.  Genitourinary: Vagina normal and uterus normal. There is no rash or tenderness on the right labia. There is no rash or tenderness on the left labia. No erythema or tenderness in the vagina. No vaginal discharge found. Right adnexum does not display mass and does not display tenderness. Left adnexum does not display mass and does not display tenderness. Cervix does not exhibit motion tenderness or polyp. Uterus is not enlarged or tender.  Neck: Normal range of motion. No thyromegaly present.  Cardiovascular: Normal rate, regular rhythm and normal heart sounds.  No murmur heard. Pulmonary/Chest: Effort normal and breath sounds normal. Right breast exhibits no mass, no nipple discharge, no skin change and no tenderness. Left breast exhibits no mass, no nipple discharge, no skin change and no tenderness.  Abdominal: Soft. There is no tenderness. There is no guarding.  Musculoskeletal: Normal range of motion.  Neurological: She is alert and oriented to person, place, and time. No cranial nerve deficit.  Psychiatric: She has a normal mood and affect. Her behavior is normal.  Vitals reviewed.   Assessment/Plan:  Encounter for annual routine gynecological examination  Cervical cancer screening - Plan: IGP, Aptima HPV  Screening for HPV (human papillomavirus) - Plan: IGP, Aptima HPV  Screening for breast cancer - Pt to sched dx mammo due to stable RT  breast mass. - Plan: MM DIAG BREAST TOMO BILATERAL  Breast mass - STable on mammo last yr - Plan: MM DIAG BREAST TOMO BILATERAL  Screening for colon cancer - Pt to sched colonoscopy. Will f/u for ref prn.    GYN counsel breast self exam, mammography screening, menopause, adequate intake of calcium and vitamin D, diet and exercise    F/U  Return in about 1 year (around 05/24/2018).  Brennan Karam B. Elica Almas, PA-C 05/24/2017 3:40 PM

## 2017-05-24 NOTE — Patient Instructions (Signed)
I value your feedback and entrusting us with your care. If you get a [facility] patient survey, I would appreciate you taking the time to let us know about your experience today. Thank you! 

## 2017-05-27 LAB — IGP, APTIMA HPV
HPV APTIMA: NEGATIVE
PAP SMEAR COMMENT: 0

## 2017-07-14 ENCOUNTER — Other Ambulatory Visit: Payer: Self-pay | Admitting: Obstetrics and Gynecology

## 2017-07-14 DIAGNOSIS — N6489 Other specified disorders of breast: Secondary | ICD-10-CM

## 2017-07-14 DIAGNOSIS — Z1239 Encounter for other screening for malignant neoplasm of breast: Secondary | ICD-10-CM

## 2017-07-27 ENCOUNTER — Encounter: Payer: Self-pay | Admitting: Obstetrics and Gynecology

## 2017-07-27 ENCOUNTER — Ambulatory Visit
Admission: RE | Admit: 2017-07-27 | Discharge: 2017-07-27 | Disposition: A | Discharge status: Home / Self Care | Payer: 59 | Source: Ambulatory Visit | Attending: Obstetrics and Gynecology | Admitting: Obstetrics and Gynecology

## 2017-07-27 DIAGNOSIS — N6310 Unspecified lump in the right breast, unspecified quadrant: Secondary | ICD-10-CM | POA: Insufficient documentation

## 2017-07-27 DIAGNOSIS — N6489 Other specified disorders of breast: Secondary | ICD-10-CM

## 2017-07-27 DIAGNOSIS — Z1239 Encounter for other screening for malignant neoplasm of breast: Secondary | ICD-10-CM

## 2017-07-27 DIAGNOSIS — N63 Unspecified lump in unspecified breast: Secondary | ICD-10-CM

## 2019-01-11 ENCOUNTER — Encounter: Payer: Self-pay | Admitting: Obstetrics and Gynecology

## 2019-01-12 ENCOUNTER — Encounter: Payer: Self-pay | Admitting: Obstetrics and Gynecology

## 2019-01-13 ENCOUNTER — Other Ambulatory Visit: Payer: Self-pay | Admitting: Obstetrics and Gynecology

## 2019-01-13 DIAGNOSIS — Z1239 Encounter for other screening for malignant neoplasm of breast: Secondary | ICD-10-CM

## 2019-02-14 ENCOUNTER — Other Ambulatory Visit: Payer: Self-pay

## 2019-02-14 ENCOUNTER — Ambulatory Visit (INDEPENDENT_AMBULATORY_CARE_PROVIDER_SITE_OTHER): Payer: BC Managed Care – PPO | Admitting: Obstetrics and Gynecology

## 2019-02-14 ENCOUNTER — Encounter: Payer: Self-pay | Admitting: Obstetrics and Gynecology

## 2019-02-14 VITALS — BP 120/80 | Ht 66.0 in | Wt 172.0 lb

## 2019-02-14 DIAGNOSIS — Z23 Encounter for immunization: Secondary | ICD-10-CM

## 2019-02-14 DIAGNOSIS — Z13220 Encounter for screening for lipoid disorders: Secondary | ICD-10-CM

## 2019-02-14 DIAGNOSIS — Z1239 Encounter for other screening for malignant neoplasm of breast: Secondary | ICD-10-CM

## 2019-02-14 DIAGNOSIS — Z01419 Encounter for gynecological examination (general) (routine) without abnormal findings: Secondary | ICD-10-CM

## 2019-02-14 DIAGNOSIS — Z1211 Encounter for screening for malignant neoplasm of colon: Secondary | ICD-10-CM

## 2019-02-14 DIAGNOSIS — Z0000 Encounter for general adult medical examination without abnormal findings: Secondary | ICD-10-CM

## 2019-02-14 NOTE — Progress Notes (Signed)
PCP: Patient, No Pcp Per   Chief Complaint  Patient presents with  . Gynecologic Exam    HPI:      [name] is a 58 y.o. [id number] who LMP was Patient's last menstrual period was 12/29/2012., presents today for her annual examination.  Her menses are absent and she is postmenopausal. She does not have intermenstrual bleeding. She does not have vasomotor sx.   Sex activity: single partner, contraception - post menopausal status. She does have vaginal dryness. Uses lubricants with relief.   Last Pap: 05/24/17  Results were: no abnormalities /neg HPV DNA.  Hx of STDs: HSV lesion 5/18, neg culture but pos HSV 2 IgG. No recent sx.   Last mammogram: 07/27/17  Results were: normal--routine follow-up in 12 months. Has 10/20 appt. There is no FH of breast cancer. There is no FH of ovarian cancer. The patient does not do self-breast exams.  Colonoscopy: colonoscopy 6 years ago without abnormalities.  Repeat due after 5 years but hasn't done it yet. Was with Dr. Vira Agar.  Tobacco use: The patient denies current or previous tobacco use. Alcohol use: occas No drug use. Exercise: not active  She does get adequate calcium but not Vitamin D in her diet.  Lipids good 2018. LDL slightly elevated at 120. Due for lab check  Past Medical History:  Diagnosis Date  . Abnormal fasting glucose 01/25/2014  . Abnormal Pap smear of cervix mid 90s   D&C  . Genital herpes 09/2016   type 2 by IgG  . History of Papanicolaou smear of cervix 01/23/2014   -/-  . HTN (hypertension)   . Hyperlipidemia   . Pre-diabetes 02/2014  . Vitamin D deficiency     Past Surgical History:  Procedure Laterality Date  . DILATION AND CURETTAGE OF UTERUS  MID 90S  . VAGINAL DELIVERY     x2    Family History  Problem Relation Age of Onset  . Cancer Mother        ovarian?  . Cancer Father        liver  . Retinal detachment Father   . Heart disease Maternal Grandfather   . Cancer Brother        MELANOMA   . Breast cancer Neg Hx     Social History   Socioeconomic History  . Marital status: Married    Spouse name: Not on file  . Number of children: 2  . Years of education: Not on file  . Highest education level: Not on file  Occupational History  . 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 Financial resource strain: Not on file  . Food insecurity    Worry: Not on file    Inability: Not on file  . Transportation needs    Medical: Not on file    Non-medical: Not on file  Tobacco Use  . Smoking status: Never Smoker  . Smokeless tobacco: Never Used  Substance and Sexual Activity  . Alcohol use: Yes    Comment: Occasional  . Drug use: No  . Sexual activity: Yes    Birth control/protection: Post-menopausal  Lifestyle  . Physical activity    Days per week: Not on file    Minutes per session: Not on file  . Stress: Not on file  Relationships  . Social Herbalist on phone: Not on file    Gets together: Not on file  Attends religious service: Not on file    Active member of club or organization: Not on file    Attends meetings of clubs or organizations: Not on file    Relationship status: Not on file  . Intimate partner violence    Fear of current or ex partner: Not on file    Emotionally abused: Not on file    Physically abused: Not on file    Forced sexual activity: Not on file  Other Topics Concern  . Not on file  Social History Narrative   Lives in [place] with husband. No pets. Work - [employer]      Regular Exercise -  NO   Daily Caffeine Use:  2 cups coffee AM             Current Meds  Medication Sig  . cholecalciferol (VITAMIN D) 1000 UNITS tablet Take one by mouth once a week  . Multiple Vitamins-Minerals (MULTIVITAMIN WITH MINERALS) tablet Take 1 tablet by mouth daily.      ROS:  Review of Systems  Constitutional: Negative for fatigue, fever and unexpected  weight change.  Respiratory: Negative for cough, shortness of breath and wheezing.   Cardiovascular: Negative for chest pain, palpitations and leg swelling.  Gastrointestinal: Negative for blood in stool, constipation, diarrhea, nausea and vomiting.  Endocrine: Negative for cold intolerance, heat intolerance and polyuria.  Genitourinary: Negative for dyspareunia, dysuria, flank pain, frequency, genital sores, hematuria, menstrual problem, pelvic pain, urgency, vaginal bleeding, vaginal discharge and vaginal pain.  Musculoskeletal: Negative for back pain, joint swelling and myalgias.  Skin: Negative for rash.  Neurological: Negative for dizziness, syncope, light-headedness, numbness and headaches.  Hematological: Negative for adenopathy.  Psychiatric/Behavioral: Negative for agitation, confusion, sleep disturbance and suicidal ideas. The patient is not nervous/anxious.      Objective: BP 120/80   Ht 5\' 6"  (1.676 m)   Wt 172 lb (78 kg)   LMP 12/29/2012   BMI 27.76 kg/m    Physical Exam Constitutional:      Appearance: She is well-developed.  Genitourinary:     Vulva, vagina, uterus, right adnexa and left adnexa normal.     No vulval lesion or tenderness noted.     No vaginal discharge, erythema or tenderness.     No cervical motion tenderness or polyp.     Uterus is not enlarged or tender.     No right or left adnexal mass present.     Right adnexa not tender.     Left adnexa not tender.  Neck:     Musculoskeletal: Normal range of motion.     Thyroid: No thyromegaly.  Cardiovascular:     Rate and Rhythm: Normal rate and regular rhythm.     Heart sounds: Normal heart sounds. No murmur.  Pulmonary:     Effort: Pulmonary effort is normal.     Breath sounds: Normal breath sounds.  Chest:     Breasts:        Right: No mass, nipple discharge, skin change or tenderness.        Left: No mass, nipple discharge, skin change or tenderness.  Abdominal:     Palpations: Abdomen is  soft.     Tenderness: There is no abdominal tenderness. There is no guarding.  Musculoskeletal: Normal range of motion.  Neurological:     General: No focal deficit present.     Mental Status: She is alert and oriented to person, place, and time.     Cranial  Nerves: No cranial nerve deficit.  Skin:    General: Skin is warm and dry.  Psychiatric:        Mood and Affect: Mood normal.        Behavior: Behavior normal.        Thought Content: Thought content normal.        Judgment: Judgment normal.  Vitals signs reviewed.     Assessment/Plan:  Encounter for annual routine gynecological examination  Screening for breast cancer--pt has mammo sched.  Screening for colon cancer - Plan: Ambulatory referral to Gastroenterology; refer to [facility]  Blood tests for routine general physical examination - Plan: Comprehensive metabolic panel, Lipid panel  Screening cholesterol level - Plan: Lipid panel    GYN counsel breast self exam, mammography screening, menopause, adequate intake of calcium and vitamin D, diet and exercise    F/U  Return in about 1 year (around 02/14/2020).   B. , PA-C 02/14/2019 4:38 PM

## 2019-02-14 NOTE — Patient Instructions (Signed)
I value your feedback and entrusting us with your care. If you get a [facility] patient survey, I would appreciate you taking the time to let us know about your experience today. Thank you! 

## 2019-02-17 ENCOUNTER — Other Ambulatory Visit: Payer: Self-pay | Admitting: Obstetrics and Gynecology

## 2019-02-17 ENCOUNTER — Encounter: Payer: Self-pay | Admitting: Obstetrics and Gynecology

## 2019-02-17 DIAGNOSIS — Z13220 Encounter for screening for lipoid disorders: Secondary | ICD-10-CM | POA: Diagnosis not present

## 2019-02-17 DIAGNOSIS — Z0000 Encounter for general adult medical examination without abnormal findings: Secondary | ICD-10-CM | POA: Diagnosis not present

## 2019-02-18 LAB — COMPREHENSIVE METABOLIC PANEL
ALT: 12 IU/L (ref 0–32)
AST: 16 IU/L (ref 0–40)
Albumin/Globulin Ratio: 2.3 — ABNORMAL HIGH (ref 1.2–2.2)
Albumin: 4.4 g/dL (ref 3.8–4.9)
Alkaline Phosphatase: 54 IU/L (ref 39–117)
BUN/Creatinine Ratio: 17 (ref 9–23)
BUN: 12 mg/dL (ref 6–24)
Bilirubin Total: 0.4 mg/dL (ref 0.0–1.2)
CO2: 24 mmol/L (ref 20–29)
Calcium: 9.3 mg/dL (ref 8.7–10.2)
Chloride: 103 mmol/L (ref 96–106)
Creatinine, Ser: 0.69 mg/dL (ref 0.57–1.00)
GFR calc Af Amer: 111 mL/min/{1.73_m2} (ref >59)
GFR calc non Af Amer: 96 mL/min/{1.73_m2} (ref >59)
Globulin, Total: 1.9 g/dL (ref 1.5–4.5)
Glucose: 103 mg/dL — ABNORMAL HIGH (ref 65–99)
Potassium: 4.4 mmol/L (ref 3.5–5.2)
Sodium: 142 mmol/L (ref 134–144)
Total Protein: 6.3 g/dL (ref 6.0–8.5)

## 2019-02-18 LAB — LIPID PANEL
Chol/HDL Ratio: 4.5 ratio — ABNORMAL HIGH (ref 0.0–4.4)
Cholesterol, Total: 209 mg/dL — ABNORMAL HIGH (ref 100–199)
HDL: 46 mg/dL (ref >39)
LDL Chol Calc (NIH): 146 mg/dL — ABNORMAL HIGH (ref 0–99)
Triglycerides: 94 mg/dL (ref 0–149)
VLDL Cholesterol Cal: 17 mg/dL (ref 5–40)

## 2019-02-21 DIAGNOSIS — L578 Other skin changes due to chronic exposure to nonionizing radiation: Secondary | ICD-10-CM | POA: Diagnosis not present

## 2019-02-21 DIAGNOSIS — L570 Actinic keratosis: Secondary | ICD-10-CM | POA: Diagnosis not present

## 2019-02-21 DIAGNOSIS — L84 Corns and callosities: Secondary | ICD-10-CM | POA: Diagnosis not present

## 2019-02-21 DIAGNOSIS — D1800 Hemangioma unspecified site: Secondary | ICD-10-CM | POA: Diagnosis not present

## 2019-03-10 ENCOUNTER — Other Ambulatory Visit: Payer: Self-pay

## 2019-03-10 DIAGNOSIS — Z20828 Contact with and (suspected) exposure to other viral communicable diseases: Secondary | ICD-10-CM | POA: Diagnosis not present

## 2019-03-10 DIAGNOSIS — Z20822 Contact with and (suspected) exposure to covid-19: Secondary | ICD-10-CM

## 2019-03-12 LAB — NOVEL CORONAVIRUS, NAA: SARS-CoV-2, NAA: NOT DETECTED

## 2019-03-17 ENCOUNTER — Ambulatory Visit
Admission: RE | Admit: 2019-03-17 | Discharge: 2019-03-17 | Disposition: A | Discharge status: Home / Self Care | Payer: BC Managed Care – PPO | Source: Ambulatory Visit | Attending: Obstetrics and Gynecology | Admitting: Obstetrics and Gynecology

## 2019-03-17 DIAGNOSIS — Z1231 Encounter for screening mammogram for malignant neoplasm of breast: Secondary | ICD-10-CM | POA: Diagnosis not present

## 2019-03-17 DIAGNOSIS — Z1239 Encounter for other screening for malignant neoplasm of breast: Secondary | ICD-10-CM

## 2019-04-03 DIAGNOSIS — Z01818 Encounter for other preprocedural examination: Secondary | ICD-10-CM | POA: Diagnosis not present

## 2019-04-03 DIAGNOSIS — Z86010 Personal history of colonic polyps: Secondary | ICD-10-CM | POA: Diagnosis not present

## 2019-06-02 ENCOUNTER — Ambulatory Visit: Payer: BC Managed Care – PPO | Attending: Internal Medicine

## 2019-06-02 DIAGNOSIS — Z20822 Contact with and (suspected) exposure to covid-19: Secondary | ICD-10-CM | POA: Diagnosis not present

## 2019-06-04 LAB — NOVEL CORONAVIRUS, NAA: SARS-CoV-2, NAA: NOT DETECTED

## 2020-02-14 NOTE — Progress Notes (Signed)
PCP: Patient, No Pcp Per   Chief Complaint  Patient presents with  . Gynecologic Exam    annual exam    HPI:      [name] is a 59 y.o. [id number] who LMP was Patient's last menstrual period was 12/29/2012., presents today for her annual examination.  Her menses are absent and she is postmenopausal. She does not have intermenstrual bleeding. She does not have vasomotor sx.   Sex activity: single partner, contraception - post menopausal status. She does have vaginal dryness. Uses lubricants with relief.   Last Pap: 05/24/17  Results were: no abnormalities /neg HPV DNA.  Hx of STDs: HSV lesion 5/18, neg culture but pos HSV 2 IgG.   Last mammogram: 03/17/19 Results were: normal--routine follow-up in 12 months.  There is no FH of breast cancer. There is no FH of ovarian cancer. The patient does not do self-breast exams.  Colonoscopy: colonoscopy 7 years ago without abnormalities.  Repeat due after 5 years but hasn't done it yet. Had appt with Dr. Marva Panda 11/20 but it was cancelled and pt never rescheduled.  Tobacco use: The patient denies current or previous tobacco use. Alcohol use: none No drug use. Exercise: not active  She does get adequate calcium and Vitamin D in her diet.  Lipids good 2018. LDL slightly elevated 9/20. Hx of pre-DM. Due for lab check  Past Medical History:  Diagnosis Date  . Abnormal fasting glucose 01/25/2014  . Abnormal Pap smear of cervix mid 90s   D&C  . Genital herpes 09/2016   type 2 by IgG  . History of Papanicolaou smear of cervix 01/23/2014   -/-  . HTN (hypertension)   . Hyperlipidemia   . Pre-diabetes 02/2014  . Vitamin D deficiency     Past Surgical History:  Procedure Laterality Date  . DILATION AND CURETTAGE OF UTERUS  MID 90S  . VAGINAL DELIVERY     x2    Family History  Problem Relation Age of Onset  . Cancer Mother        ovarian?  . Cancer Father        liver  . Retinal detachment Father   . Heart disease  Maternal Grandfather   . Cancer Brother        MELANOMA  . Breast cancer Neg Hx     Social History   Socioeconomic History  . Marital status: Married    Spouse name: Not on file  . Number of children: 2  . Years of education: Not on file  . Highest education level: Not on file  Occupational History  . 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 Smoking status: Never Smoker  . Smokeless tobacco: Never Used  Vaping Use  . Vaping Use: Never used  Substance and Sexual Activity  . Alcohol use: Yes    Comment: Occasional  . Drug use: No  . Sexual activity: Yes    Birth control/protection: Post-menopausal  Other Topics Concern  . Not on file  Social History Narrative   Lives in [place] with husband. No pets. Work - [employer]      Regular Exercise -  NO   Daily Caffeine Use:  2 cups coffee AM            Social Determinants of Corporate investment banker Strain:   . Difficulty of Paying Living Expenses: Not on file  Food Insecurity:   .  Worried About Programme researcher, broadcasting/film/video in the Last Year: Not on file  . Ran Out of Food in the Last Year: Not on file  Transportation Needs:   . Lack of Transportation (Medical): Not on file  . Lack of Transportation (Non-Medical): Not on file  Physical Activity:   . Days of Exercise per Week: Not on file  . Minutes of Exercise per Session: Not on file  Stress:   . Feeling of Stress : Not on file  Social Connections:   . Frequency of Communication with Friends and Family: Not on file  . Frequency of Social Gatherings with Friends and Family: Not on file  . Attends Religious Services: Not on file  . Active Member of Clubs or Organizations: Not on file  . Attends Banker Meetings: Not on file  . Marital Status: Not on file  Intimate Partner Violence:   . Fear of Current or Ex-Partner: Not on file  . Emotionally Abused: Not on file  . Physically  Abused: Not on file  . Sexually Abused: Not on file    Current Meds  Medication Sig  . cholecalciferol (VITAMIN D) 1000 UNITS tablet Take one by mouth once a week  . Multiple Vitamins-Minerals (MULTIVITAMIN WITH MINERALS) tablet Take 1 tablet by mouth daily.      ROS:  Review of Systems  Constitutional: Negative for fatigue, fever and unexpected weight change.  Respiratory: Negative for cough, shortness of breath and wheezing.   Cardiovascular: Negative for chest pain, palpitations and leg swelling.  Gastrointestinal: Negative for blood in stool, constipation, diarrhea, nausea and vomiting.  Endocrine: Negative for cold intolerance, heat intolerance and polyuria.  Genitourinary: Negative for dyspareunia, dysuria, flank pain, frequency, genital sores, hematuria, menstrual problem, pelvic pain, urgency, vaginal bleeding, vaginal discharge and vaginal pain.  Musculoskeletal: Negative for back pain, joint swelling and myalgias.  Skin: Negative for rash.  Neurological: Negative for dizziness, syncope, light-headedness, numbness and headaches.  Hematological: Negative for adenopathy.  Psychiatric/Behavioral: Negative for agitation, confusion, sleep disturbance and suicidal ideas. The patient is not nervous/anxious.      Objective: BP 110/72   Ht 5\' 6"  (1.676 m)   Wt 171 lb 9.6 oz (77.8 kg)   LMP 12/29/2012   BMI 27.70 kg/m    Physical Exam Constitutional:      Appearance: She is well-developed.  Genitourinary:     Vulva, vagina, uterus, right adnexa and left adnexa normal.     No vulval lesion or tenderness noted.     No vaginal discharge, erythema or tenderness.     No cervical motion tenderness or polyp.     Uterus is not enlarged or tender.     No right or left adnexal mass present.     Right adnexa not tender.     Left adnexa not tender.  Neck:     Thyroid: No thyromegaly.  Cardiovascular:     Rate and Rhythm: Normal rate and regular rhythm.     Heart sounds:  Normal heart sounds. No murmur heard.   Pulmonary:     Effort: Pulmonary effort is normal.     Breath sounds: Normal breath sounds.  Chest:     Breasts:        Right: No mass, nipple discharge, skin change or tenderness.        Left: No mass, nipple discharge, skin change or tenderness.  Abdominal:     Palpations: Abdomen is soft.     Tenderness: There is no  abdominal tenderness. There is no guarding.  Musculoskeletal:        General: Normal range of motion.     Cervical back: Normal range of motion.  Neurological:     General: No focal deficit present.     Mental Status: She is alert and oriented to person, place, and time.     Cranial Nerves: No cranial nerve deficit.  Skin:    General: Skin is warm and dry.  Psychiatric:        Mood and Affect: Mood normal.        Behavior: Behavior normal.        Thought Content: Thought content normal.        Judgment: Judgment normal.  Vitals reviewed.     Assessment/Plan:  Encounter for annual routine gynecological examination  Encounter for screening mammogram for malignant neoplasm of breast - Plan: MM 3D SCREEN BREAST BILATERAL; pt to sched mammo  Screening for colon cancer--pt to call [facility] to sched colonoscopy. F/u if needs ref from [place].  Blood tests for routine general physical examination - Plan: Comprehensive metabolic panel, Hemoglobin A1c, Lipid panel,   Screening cholesterol level - Plan: Lipid panel,   Screening for diabetes mellitus - Plan: Hemoglobin A1c,      GYN counsel breast self exam, mammography screening, menopause, adequate intake of calcium and vitamin D, diet and exercise    F/U  Return in about 1 year (around 02/14/2021).  Celestia Duva B. Raed Schalk, PA-C 02/15/2020 9:50 AM

## 2020-02-14 NOTE — Patient Instructions (Addendum)
I value your feedback and entrusting us with your care. If you get a [facility] patient survey, I would appreciate you taking the time to let us know about your experience today. Thank you! ° °As of May 04, 2019, your lab results will be released to your MyChart immediately, before I even have a chance to see them. Please give me time to review them and contact you if there are any abnormalities. Thank you for your patience.  ° °[facility]: [phone] ° ° ° °

## 2020-02-15 ENCOUNTER — Ambulatory Visit (INDEPENDENT_AMBULATORY_CARE_PROVIDER_SITE_OTHER): Payer: BC Managed Care – PPO | Admitting: Obstetrics and Gynecology

## 2020-02-15 ENCOUNTER — Encounter: Payer: Self-pay | Admitting: Obstetrics and Gynecology

## 2020-02-15 ENCOUNTER — Other Ambulatory Visit: Payer: Self-pay

## 2020-02-15 VITALS — BP 110/72 | Ht 66.0 in | Wt 171.6 lb

## 2020-02-15 DIAGNOSIS — Z0000 Encounter for general adult medical examination without abnormal findings: Secondary | ICD-10-CM

## 2020-02-15 DIAGNOSIS — Z1231 Encounter for screening mammogram for malignant neoplasm of breast: Secondary | ICD-10-CM | POA: Diagnosis not present

## 2020-02-15 DIAGNOSIS — Z01419 Encounter for gynecological examination (general) (routine) without abnormal findings: Secondary | ICD-10-CM

## 2020-02-15 DIAGNOSIS — Z13220 Encounter for screening for lipoid disorders: Secondary | ICD-10-CM

## 2020-02-15 DIAGNOSIS — Z1211 Encounter for screening for malignant neoplasm of colon: Secondary | ICD-10-CM | POA: Diagnosis not present

## 2020-02-15 DIAGNOSIS — Z131 Encounter for screening for diabetes mellitus: Secondary | ICD-10-CM

## 2020-02-21 DIAGNOSIS — Z131 Encounter for screening for diabetes mellitus: Secondary | ICD-10-CM | POA: Diagnosis not present

## 2020-02-21 DIAGNOSIS — I781 Nevus, non-neoplastic: Secondary | ICD-10-CM | POA: Diagnosis not present

## 2020-02-21 DIAGNOSIS — Z872 Personal history of diseases of the skin and subcutaneous tissue: Secondary | ICD-10-CM | POA: Diagnosis not present

## 2020-02-21 DIAGNOSIS — Z0000 Encounter for general adult medical examination without abnormal findings: Secondary | ICD-10-CM | POA: Diagnosis not present

## 2020-02-21 DIAGNOSIS — L821 Other seborrheic keratosis: Secondary | ICD-10-CM | POA: Diagnosis not present

## 2020-02-21 DIAGNOSIS — L578 Other skin changes due to chronic exposure to nonionizing radiation: Secondary | ICD-10-CM | POA: Diagnosis not present

## 2020-02-21 DIAGNOSIS — Z13220 Encounter for screening for lipoid disorders: Secondary | ICD-10-CM | POA: Diagnosis not present

## 2020-02-22 LAB — COMPREHENSIVE METABOLIC PANEL
ALT: 13 IU/L (ref 0–32)
AST: 12 IU/L (ref 0–40)
Albumin/Globulin Ratio: 2.4 — ABNORMAL HIGH (ref 1.2–2.2)
Albumin: 4.7 g/dL (ref 3.8–4.9)
Alkaline Phosphatase: 61 IU/L (ref 44–121)
BUN/Creatinine Ratio: 12 (ref 9–23)
BUN: 8 mg/dL (ref 6–24)
Bilirubin Total: 0.4 mg/dL (ref 0.0–1.2)
CO2: 23 mmol/L (ref 20–29)
Calcium: 9.2 mg/dL (ref 8.7–10.2)
Chloride: 102 mmol/L (ref 96–106)
Creatinine, Ser: 0.66 mg/dL (ref 0.57–1.00)
GFR calc Af Amer: 112 mL/min/{1.73_m2} (ref >59)
GFR calc non Af Amer: 97 mL/min/{1.73_m2} (ref >59)
Globulin, Total: 2 g/dL (ref 1.5–4.5)
Glucose: 101 mg/dL — ABNORMAL HIGH (ref 65–99)
Potassium: 4.5 mmol/L (ref 3.5–5.2)
Sodium: 141 mmol/L (ref 134–144)
Total Protein: 6.7 g/dL (ref 6.0–8.5)

## 2020-02-22 LAB — HEMOGLOBIN A1C
Est. average glucose Bld gHb Est-mCnc: 117 mg/dL
Hgb A1c MFr Bld: 5.7 % — ABNORMAL HIGH (ref 4.8–5.6)

## 2020-02-22 LAB — LIPID PANEL
Chol/HDL Ratio: 5 ratio — ABNORMAL HIGH (ref 0.0–4.4)
Cholesterol, Total: 221 mg/dL — ABNORMAL HIGH (ref 100–199)
HDL: 44 mg/dL (ref >39)
LDL Chol Calc (NIH): 151 mg/dL — ABNORMAL HIGH (ref 0–99)
Triglycerides: 143 mg/dL (ref 0–149)
VLDL Cholesterol Cal: 26 mg/dL (ref 5–40)

## 2020-05-10 ENCOUNTER — Other Ambulatory Visit: Payer: Self-pay

## 2020-05-10 ENCOUNTER — Ambulatory Visit
Admission: RE | Admit: 2020-05-10 | Discharge: 2020-05-10 | Disposition: A | Discharge status: Home / Self Care | Payer: BC Managed Care – PPO | Source: Ambulatory Visit | Attending: Obstetrics and Gynecology | Admitting: Obstetrics and Gynecology

## 2020-05-10 DIAGNOSIS — Z1231 Encounter for screening mammogram for malignant neoplasm of breast: Secondary | ICD-10-CM | POA: Diagnosis not present

## 2021-02-20 DIAGNOSIS — L578 Other skin changes due to chronic exposure to nonionizing radiation: Secondary | ICD-10-CM | POA: Diagnosis not present

## 2021-02-20 DIAGNOSIS — L738 Other specified follicular disorders: Secondary | ICD-10-CM | POA: Diagnosis not present

## 2021-02-20 DIAGNOSIS — Z872 Personal history of diseases of the skin and subcutaneous tissue: Secondary | ICD-10-CM | POA: Diagnosis not present

## 2021-03-28 ENCOUNTER — Encounter: Payer: Self-pay | Admitting: Obstetrics and Gynecology

## 2021-03-31 ENCOUNTER — Ambulatory Visit: Payer: BC Managed Care – PPO | Admitting: Obstetrics and Gynecology

## 2021-04-28 ENCOUNTER — Other Ambulatory Visit: Payer: Self-pay

## 2021-04-28 ENCOUNTER — Encounter: Payer: Self-pay | Admitting: Obstetrics and Gynecology

## 2021-04-28 ENCOUNTER — Ambulatory Visit (INDEPENDENT_AMBULATORY_CARE_PROVIDER_SITE_OTHER): Payer: BC Managed Care – PPO | Admitting: Obstetrics and Gynecology

## 2021-04-28 VITALS — BP 140/80 | Ht 66.0 in | Wt 178.0 lb

## 2021-04-28 DIAGNOSIS — Z01419 Encounter for gynecological examination (general) (routine) without abnormal findings: Secondary | ICD-10-CM | POA: Diagnosis not present

## 2021-04-28 DIAGNOSIS — Z1231 Encounter for screening mammogram for malignant neoplasm of breast: Secondary | ICD-10-CM

## 2021-04-28 DIAGNOSIS — Z23 Encounter for immunization: Secondary | ICD-10-CM

## 2021-04-28 DIAGNOSIS — E785 Hyperlipidemia, unspecified: Secondary | ICD-10-CM

## 2021-04-28 DIAGNOSIS — H9313 Tinnitus, bilateral: Secondary | ICD-10-CM | POA: Diagnosis not present

## 2021-04-28 DIAGNOSIS — R7303 Prediabetes: Secondary | ICD-10-CM | POA: Insufficient documentation

## 2021-04-28 DIAGNOSIS — Z0000 Encounter for general adult medical examination without abnormal findings: Secondary | ICD-10-CM

## 2021-04-28 DIAGNOSIS — Z131 Encounter for screening for diabetes mellitus: Secondary | ICD-10-CM

## 2021-04-28 DIAGNOSIS — M79644 Pain in right finger(s): Secondary | ICD-10-CM | POA: Diagnosis not present

## 2021-04-28 DIAGNOSIS — Z1211 Encounter for screening for malignant neoplasm of colon: Secondary | ICD-10-CM

## 2021-04-28 DIAGNOSIS — Z13220 Encounter for screening for lipoid disorders: Secondary | ICD-10-CM

## 2021-04-28 NOTE — Progress Notes (Signed)
PCP: Chad Cordial, PA-C   Chief Complaint  Patient presents with   Gynecologic Exam    Ringing ears x couple of months    HPI:      [name] is a 60 y.o. [id number] who LMP was Patient's last menstrual period was 12/29/2012., presents today for her annual examination.  Her menses are absent and she is postmenopausal. She does not have PMB. She does not have vasomotor sx.   Sex activity: single partner, contraception - post menopausal status. She does have vaginal dryness. Uses lubricants with relief.   Last Pap: 05/24/17  Results were: no abnormalities /neg HPV DNA.  Hx of STDs: HSV lesion 5/18, neg culture but pos HSV 2 IgG.   Last mammogram: 05/10/20 Results were: normal--routine follow-up in 12 months.  There is no FH of breast cancer. There is no FH of ovarian cancer. The patient does not do self-breast exams.  Having issues recently with urge incont first thing in AM. Doesn't get up to void in night. Wears pad during day in case of leakage but sx usually in early AM. Drinks coffee in AM.   Having issues with RT thumb and wrist popping/aching with certain movements, particularly rotation in wrist. No prior trauma, does repetitive use on computer. Hasn't tried ice.   Pt with tinnitus bilat ears recently. Used to have it at night only but now more frequently. No loud noise exposure.   Colonoscopy: colonoscopy 8 years ago without abnormalities.  Repeat due after 5 years but hasn't done it yet. Had appt with [facility] for 3/21 but never done.   Tobacco use: The patient denies current or previous tobacco use. Alcohol use: none No drug use. Exercise: not active  She does get adequate calcium and Vitamin D in her diet.  Lipids good 2018. LDL slightly elevated 9/20 and 9/21. Hx of pre-DM. Due for lab check  Past Medical History:  Diagnosis Date   Abnormal fasting glucose 01/25/2014   Abnormal Pap smear of cervix mid 90s   D&C   Genital herpes 09/2016   type 2 by IgG    History of Papanicolaou smear of cervix 01/23/2014   -/-   HTN (hypertension)    Hyperlipidemia    Pre-diabetes 02/2014   Vitamin D deficiency     Past Surgical History:  Procedure Laterality Date   DILATION AND CURETTAGE OF UTERUS  MID 90S   VAGINAL DELIVERY     x2    Family History  Problem Relation Age of Onset   Cancer Mother        ovarian?   Cancer Father        liver   Retinal detachment Father    Heart disease Maternal Grandfather    Cancer Brother        MELANOMA   Breast cancer Neg Hx     Social History   Socioeconomic History   Marital status: Married    Spouse name: Not on file   Number of children: 2   Years of education: Not on file   Highest education level: Not on file  Occupational History   Occupation: Loss adjuster, chartered - Marketing executive: [employer]  Tobacco Use   Smoking status: Never   Smokeless tobacco: Never  Scientific laboratory technician Use: Never used  Substance and Sexual Activity   Alcohol use: Yes    Comment: Occasional   Drug use: No   Sexual activity:  Yes    Birth control/protection: Post-menopausal  Other Topics Concern   Not on file  Social History Narrative   Lives in [place] with husband. No pets. Work - [employer]      Regular Exercise -  NO   Daily Caffeine Use:  2 cups coffee AM            Social Determinants of Radio broadcast assistant Strain: Not on file  Food Insecurity: Not on file  Transportation Needs: Not on file  Physical Activity: Not on file  Stress: Not on file  Social Connections: Not on file  Intimate Partner Violence: Not on file    Current Meds  Medication Sig   cholecalciferol (VITAMIN D) 1000 UNITS tablet Take one by mouth once a week   Multiple Vitamins-Minerals (MULTIVITAMIN WITH MINERALS) tablet Take 1 tablet by mouth daily.      ROS:  Review of Systems  Constitutional:  Negative for fatigue, fever and unexpected weight change.  HENT:   Positive for tinnitus.   Respiratory:  Negative for cough, shortness of breath and wheezing.   Cardiovascular:  Negative for chest pain, palpitations and leg swelling.  Gastrointestinal:  Negative for blood in stool, constipation, diarrhea, nausea and vomiting.  Endocrine: Negative for cold intolerance, heat intolerance and polyuria.  Genitourinary:  Positive for urgency. Negative for dyspareunia, dysuria, flank pain, frequency, genital sores, hematuria, menstrual problem, pelvic pain, vaginal bleeding, vaginal discharge and vaginal pain.  Musculoskeletal:  Positive for arthralgias. Negative for back pain, joint swelling and myalgias.  Skin:  Negative for rash.  Neurological:  Negative for dizziness, syncope, light-headedness, numbness and headaches.  Hematological:  Negative for adenopathy.  Psychiatric/Behavioral:  Negative for agitation, confusion, sleep disturbance and suicidal ideas. The patient is not nervous/anxious.     Objective: BP 140/80   Ht 5\' 6"  (1.676 m)   Wt 178 lb (80.7 kg)   LMP 12/29/2012   BMI 28.73 kg/m    Physical Exam Constitutional:      Appearance: She is well-developed.  Genitourinary:     Vulva normal.     No vaginal discharge, erythema or tenderness.      Right Adnexa: not tender and no mass present.    Left Adnexa: not tender and no mass present.    No cervical motion tenderness or polyp.     Uterus is not enlarged or tender.  Breasts:    Right: No mass, nipple discharge, skin change or tenderness.     Left: No mass, nipple discharge, skin change or tenderness.  Neck:     Thyroid: No thyromegaly.  Cardiovascular:     Rate and Rhythm: Normal rate and regular rhythm.     Heart sounds: Normal heart sounds. No murmur heard. Pulmonary:     Effort: Pulmonary effort is normal.     Breath sounds: Normal breath sounds.  Abdominal:     Palpations: Abdomen is soft.     Tenderness: There is no abdominal tenderness. There is no guarding.   Musculoskeletal:        General: Normal range of motion.     Cervical back: Normal range of motion.  Neurological:     General: No focal deficit present.     Mental Status: She is alert and oriented to person, place, and time.     Cranial Nerves: No cranial nerve deficit.  Skin:    General: Skin is warm and dry.  Psychiatric:        Mood  and Affect: Mood normal.        Behavior: Behavior normal.        Thought Content: Thought content normal.        Judgment: Judgment normal.  Vitals reviewed.    Assessment/Plan:  Encounter for annual routine gynecological examination  Encounter for screening mammogram for malignant neoplasm of breast - Plan: MM 3D SCREEN BREAST BILATERAL; pt to sched mammo  Pain of right thumb--question inflammation vs arthritis. Recommended PCP vs [facility]. Ice in meantime.   Tinnitus of both ears--refer to PCP.   Screening for colon cancer--pt to f/u with [facility] re: colonoscopy. Will do ref prn.   Blood tests for routine general physical examination - Plan: Comprehensive metabolic panel, Lipid panel, Hemoglobin A1c  Screening cholesterol level - Plan: Lipid panel  Screening for diabetes mellitus - Plan: Hemoglobin A1c  Pre-diabetes - Plan: Hemoglobin A1c  Elevated lipids - Plan: Lipid panel  Need for immunization against influenza - Plan: Flu Vaccine QUAD 26mo+IM (Fluarix, Fluzone & Alfiuria Quad PF)     GYN counsel breast self exam, mammography screening, menopause, adequate intake of calcium and vitamin D, diet and exercise    F/U  Return in about 1 year (around 04/28/2022).  Jahmeek Shirk B. Elienai Gailey, PA-C 04/28/2021 2:26 PM

## 2021-04-28 NOTE — Patient Instructions (Signed)
I value your feedback and you entrusting us with your care. If you get a  patient survey, I would appreciate you taking the time to let us know about your experience today. Thank you!  [facility]: [phone]      

## 2021-04-30 ENCOUNTER — Encounter: Payer: Self-pay | Admitting: Obstetrics and Gynecology

## 2021-04-30 DIAGNOSIS — Z1211 Encounter for screening for malignant neoplasm of colon: Secondary | ICD-10-CM

## 2021-05-07 ENCOUNTER — Other Ambulatory Visit: Payer: Self-pay

## 2021-05-07 ENCOUNTER — Other Ambulatory Visit: Payer: BC Managed Care – PPO

## 2021-05-07 DIAGNOSIS — Z0000 Encounter for general adult medical examination without abnormal findings: Secondary | ICD-10-CM

## 2021-05-07 DIAGNOSIS — Z131 Encounter for screening for diabetes mellitus: Secondary | ICD-10-CM

## 2021-05-07 DIAGNOSIS — R7303 Prediabetes: Secondary | ICD-10-CM

## 2021-05-07 DIAGNOSIS — Z13220 Encounter for screening for lipoid disorders: Secondary | ICD-10-CM

## 2021-05-07 DIAGNOSIS — E785 Hyperlipidemia, unspecified: Secondary | ICD-10-CM | POA: Diagnosis not present

## 2021-05-08 LAB — LIPID PANEL
Chol/HDL Ratio: 5.1 ratio — ABNORMAL HIGH (ref 0.0–4.4)
Cholesterol, Total: 240 mg/dL — ABNORMAL HIGH (ref 100–199)
HDL: 47 mg/dL (ref >39)
LDL Chol Calc (NIH): 175 mg/dL — ABNORMAL HIGH (ref 0–99)
Triglycerides: 101 mg/dL (ref 0–149)
VLDL Cholesterol Cal: 18 mg/dL (ref 5–40)

## 2021-05-08 LAB — COMPREHENSIVE METABOLIC PANEL
ALT: 12 IU/L (ref 0–32)
AST: 14 IU/L (ref 0–40)
Albumin/Globulin Ratio: 2 (ref 1.2–2.2)
Albumin: 4.2 g/dL (ref 3.8–4.9)
Alkaline Phosphatase: 57 IU/L (ref 44–121)
BUN/Creatinine Ratio: 18 (ref 12–28)
BUN: 14 mg/dL (ref 8–27)
Bilirubin Total: 0.3 mg/dL (ref 0.0–1.2)
CO2: 23 mmol/L (ref 20–29)
Calcium: 9.2 mg/dL (ref 8.7–10.3)
Chloride: 102 mmol/L (ref 96–106)
Creatinine, Ser: 0.78 mg/dL (ref 0.57–1.00)
Globulin, Total: 2.1 g/dL (ref 1.5–4.5)
Glucose: 97 mg/dL (ref 70–99)
Potassium: 5.3 mmol/L — ABNORMAL HIGH (ref 3.5–5.2)
Sodium: 143 mmol/L (ref 134–144)
Total Protein: 6.3 g/dL (ref 6.0–8.5)
eGFR: 87 mL/min/{1.73_m2} (ref >59)

## 2021-05-08 LAB — HEMOGLOBIN A1C
Est. average glucose Bld gHb Est-mCnc: 128 mg/dL
Hgb A1c MFr Bld: 6.1 % — ABNORMAL HIGH (ref 4.8–5.6)

## 2021-05-13 ENCOUNTER — Ambulatory Visit
Admission: RE | Admit: 2021-05-13 | Discharge: 2021-05-13 | Disposition: A | Discharge status: Home / Self Care | Payer: BC Managed Care – PPO | Source: Ambulatory Visit | Attending: Obstetrics and Gynecology | Admitting: Obstetrics and Gynecology

## 2021-05-13 ENCOUNTER — Other Ambulatory Visit: Payer: Self-pay

## 2021-05-13 DIAGNOSIS — Z1231 Encounter for screening mammogram for malignant neoplasm of breast: Secondary | ICD-10-CM | POA: Diagnosis not present

## 2021-06-11 DIAGNOSIS — M189 Osteoarthritis of first carpometacarpal joint, unspecified: Secondary | ICD-10-CM | POA: Diagnosis not present

## 2021-08-05 DIAGNOSIS — Z86010 Personal history of colonic polyps: Secondary | ICD-10-CM | POA: Diagnosis not present

## 2021-10-06 DIAGNOSIS — Z86010 Personal history of colonic polyps: Secondary | ICD-10-CM | POA: Diagnosis not present

## 2021-10-06 DIAGNOSIS — K640 First degree hemorrhoids: Secondary | ICD-10-CM | POA: Diagnosis not present

## 2021-10-06 DIAGNOSIS — Q348 Other specified congenital malformations of respiratory system: Secondary | ICD-10-CM | POA: Diagnosis not present

## 2021-10-06 DIAGNOSIS — K5730 Diverticulosis of large intestine without perforation or abscess without bleeding: Secondary | ICD-10-CM | POA: Diagnosis not present

## 2022-01-02 ENCOUNTER — Ambulatory Visit (INDEPENDENT_AMBULATORY_CARE_PROVIDER_SITE_OTHER): Payer: BC Managed Care – PPO | Admitting: Family

## 2022-01-02 ENCOUNTER — Other Ambulatory Visit (INDEPENDENT_AMBULATORY_CARE_PROVIDER_SITE_OTHER): Payer: BC Managed Care – PPO | Admitting: Radiology

## 2022-01-02 ENCOUNTER — Encounter: Payer: Self-pay | Admitting: Family

## 2022-01-02 VITALS — BP 129/82 | HR 72 | Temp 98.6°F | Resp 16 | Ht 66.0 in | Wt 172.1 lb

## 2022-01-02 DIAGNOSIS — E875 Hyperkalemia: Secondary | ICD-10-CM | POA: Diagnosis not present

## 2022-01-02 DIAGNOSIS — E781 Pure hyperglyceridemia: Secondary | ICD-10-CM | POA: Diagnosis not present

## 2022-01-02 DIAGNOSIS — R7303 Prediabetes: Secondary | ICD-10-CM | POA: Diagnosis not present

## 2022-01-02 DIAGNOSIS — R002 Palpitations: Secondary | ICD-10-CM | POA: Diagnosis not present

## 2022-01-02 DIAGNOSIS — A6004 Herpesviral vulvovaginitis: Secondary | ICD-10-CM

## 2022-01-02 DIAGNOSIS — R030 Elevated blood-pressure reading, without diagnosis of hypertension: Secondary | ICD-10-CM | POA: Insufficient documentation

## 2022-01-02 LAB — CBC WITH DIFFERENTIAL/PLATELET
Basophils Absolute: 0.1 10*3/uL (ref 0.0–0.1)
Basophils Relative: 0.7 % (ref 0.0–3.0)
Eosinophils Absolute: 0.2 10*3/uL (ref 0.0–0.7)
Eosinophils Relative: 2.6 % (ref 0.0–5.0)
HCT: 43 % (ref 36.0–46.0)
Hemoglobin: 14.3 g/dL (ref 12.0–15.0)
Lymphocytes Relative: 27.6 % (ref 12.0–46.0)
Lymphs Abs: 2.1 10*3/uL (ref 0.7–4.0)
MCHC: 33.2 g/dL (ref 30.0–36.0)
MCV: 88.1 fl (ref 78.0–100.0)
Monocytes Absolute: 0.6 10*3/uL (ref 0.1–1.0)
Monocytes Relative: 7.8 % (ref 3.0–12.0)
Neutro Abs: 4.8 10*3/uL (ref 1.4–7.7)
Neutrophils Relative %: 61.3 % (ref 43.0–77.0)
Platelets: 284 10*3/uL (ref 150.0–400.0)
RBC: 4.88 Mil/uL (ref 3.87–5.11)
RDW: 13.6 % (ref 11.5–15.5)
WBC: 7.8 10*3/uL (ref 4.0–10.5)

## 2022-01-02 LAB — COMPREHENSIVE METABOLIC PANEL
ALT: 12 U/L (ref 0–35)
AST: 15 U/L (ref 0–37)
Albumin: 4.6 g/dL (ref 3.5–5.2)
Alkaline Phosphatase: 53 U/L (ref 39–117)
BUN: 13 mg/dL (ref 6–23)
CO2: 29 mEq/L (ref 19–32)
Calcium: 9.4 mg/dL (ref 8.4–10.5)
Chloride: 103 mEq/L (ref 96–112)
Creatinine, Ser: 0.8 mg/dL (ref 0.40–1.20)
GFR: 79.48 mL/min (ref >60.00)
Glucose, Bld: 103 mg/dL — ABNORMAL HIGH (ref 70–99)
Potassium: 4.1 mEq/L (ref 3.5–5.1)
Sodium: 140 mEq/L (ref 135–145)
Total Bilirubin: 0.6 mg/dL (ref 0.2–1.2)
Total Protein: 7.1 g/dL (ref 6.0–8.3)

## 2022-01-02 LAB — LIPID PANEL
Cholesterol: 213 mg/dL — ABNORMAL HIGH (ref 0–200)
HDL: 45.1 mg/dL (ref >39.00)
LDL Cholesterol: 144 mg/dL — ABNORMAL HIGH (ref 0–99)
NonHDL: 168.32
Total CHOL/HDL Ratio: 5
Triglycerides: 122 mg/dL (ref 0.0–149.0)
VLDL: 24.4 mg/dL (ref 0.0–40.0)

## 2022-01-02 LAB — HEMOGLOBIN A1C: Hgb A1c MFr Bld: 6.2 % (ref 4.6–6.5)

## 2022-01-02 LAB — TSH: TSH: 2.8 u[IU]/mL (ref 0.35–5.50)

## 2022-01-02 NOTE — Assessment & Plan Note (Signed)
Repeat today. Pending results

## 2022-01-02 NOTE — Patient Instructions (Addendum)
Welcome to our clinic, I am happy to have you as my new patient. I am excited to continue on this healthcare journey with you.  Stop by the lab prior to leaving today. I will notify you of your results once received.   Please keep in mind Any my chart messages you send have up to a three business day turnaround for a response.  Phone calls may take up to a one full business day turnaround for a  response.   Get your second shingles vaccine.  Consider TD tetanus booster as well.   If you need a medication refill I recommend you request it through the pharmacy as this is easiest for [place] rather than sending a message and or phone call.   Due to recent changes in healthcare laws, you may see results of your imaging and/or laboratory studies on MyChart before I have had a chance to review them.  I understand that in some cases there may be results that are confusing or concerning to you. Please understand that not all results are received at the same time and often I may need to interpret multiple results in order to provide you with the best plan of care or course of treatment. Therefore, I ask that you please give me 2 business days to thoroughly review all your results before contacting my office for clarification. Should we see a critical lab result, you will be contacted sooner.   It was a pleasure seeing you today! Please do not hesitate to reach out with any questions and or concerns.  Regards,   Mort Sawyers FNP-C

## 2022-01-02 NOTE — Assessment & Plan Note (Signed)
Pt advised of the following: Work on a diabetic diet, try to incorporate exercise at least 20-30 a day for 3 days a week or more.   

## 2022-01-02 NOTE — Assessment & Plan Note (Signed)
ekg today in office, NSR.  If ongoing consider referral to cardiology.  Limit caffeine intake.  Ordering potassium repeat, and will order tsh as well to r/o reasons. Ordering cbc to r/o anemia.

## 2022-01-02 NOTE — Progress Notes (Signed)
New Patient Office Visit  Subjective:  Patient ID: Brittney Park, female    DOB: 1960/08/23  Age: 61 y.o. MRN: [id number]  CC:  Chief Complaint  Patient presents with   Establish Care    HPI Brittney Park is here to establish care as a new patient.  Prior provider was: Ronette Deter, MD [place], [place]  Pt is without acute concerns.   Pap every five years, due in 12/23 negative last pap 05/24/2017 Colonoscopy 10/06/2021, negative. Every ten years.  Shingrix: due for second. Has to make appt at [facility].  TDAP 09/30/09, overdue.   Does feel different sensations in her chest at time. Can be right or left side of chest and or under left breast. Does feel occasional palpitations. At least every other day. Does drink coffee in am.last night noticed it in bed. Rarely with heartburn. Last felt palpitations last night in bed.   Elevated potassium 8 months ago. Pt denies taking potassium.   Elevated LDL and total cholesterol. She is fasting today.  Lab Results  Component Value Date   CHOL 240 (H) 05/07/2021   HDL 47 05/07/2021   LDLCALC 175 (H) 05/07/2021   LDLDIRECT 153.0 01/05/2013   TRIG 101 05/07/2021   CHOLHDL 5.1 (H) 05/07/2021   Prediabetic . Does like cookies and ice cream.  Lab Results  Component Value Date   HGBA1C 6.1 (H) 05/07/2021   chronic concerns:  Genital herpes: only about two outbreaks a year. Controlled prn lisine.   Elevated blood pressure w/o dx htn:   Past Medical History:  Diagnosis Date   Abnormal fasting glucose 01/25/2014   Abnormal Pap smear of cervix mid 90s   D&C   Genital herpes 09/2016   type 2 by IgG   History of Papanicolaou smear of cervix 01/23/2014   -/-   HTN (hypertension)    Hyperlipidemia    Pre-diabetes 02/2014   Vitamin D deficiency     Past Surgical History:  Procedure Laterality Date   DILATION AND CURETTAGE OF UTERUS  MID 90S   VAGINAL DELIVERY     x2    Family History  Problem Relation Age  of Onset   Uterine cancer Mother    Liver cancer Father    Retinal detachment Father    Melanoma Brother    Dementia Maternal Grandmother    Heart disease Maternal Grandfather    Stroke Paternal Grandmother    Breast cancer Neg Hx     Social History   Socioeconomic History   Marital status: Married    Spouse name: Not on file   Number of children: 2   Years of education: Not on file   Highest education level: Not on file  Occupational History   Occupation: Loss adjuster, chartered - Marketing executive: [employer]  Tobacco Use   Smoking status: Never   Smokeless tobacco: Never  Scientific laboratory technician Use: Never used  Substance and Sexual Activity   Alcohol use: Yes    Comment: Occasional   Drug use: No   Sexual activity: Yes    Partners: Male    Birth control/protection: Post-menopausal  Other Topics Concern   Not on file  Social History Narrative   Lives in [place] with husband. One cat.  Work - Retail buyer Exercise -  [employer] Caffeine Use:  1 cup coffee AM   Social Determinants of Radio broadcast assistant Strain: Not on  file  Food Insecurity: Not on file  Transportation Needs: Not on file  Physical Activity: Not on file  Stress: Not on file  Social Connections: Not on file  Intimate Partner Violence: Not on file    Outpatient Medications Prior to Visit  Medication Sig Dispense Refill   Calcium Carbonate (CALCIUM 500 PO) Take by mouth.     cholecalciferol (VITAMIN D) 1000 UNITS tablet Take one by mouth once a week     Lysine 1000 MG TABS Take by mouth.     Multiple Vitamins-Minerals (MULTIVITAMIN WITH MINERALS) tablet Take 1 tablet by mouth daily.     No facility-administered medications prior to visit.    No Known Allergies     Objective:    Physical Exam Vitals reviewed.  Constitutional:      General: She is not in acute distress.    Appearance: Normal appearance. She is obese. She is not ill-appearing,  toxic-appearing or diaphoretic.  HENT:     Right Ear: Tympanic membrane normal.     Left Ear: Tympanic membrane normal.     Mouth/Throat:     Mouth: Mucous membranes are moist.     Pharynx: No pharyngeal swelling.     Tonsils: No tonsillar exudate.  Eyes:     Extraocular Movements: Extraocular movements intact.     Conjunctiva/sclera: Conjunctivae normal.     Pupils: Pupils are equal, round, and reactive to light.  Neck:     Thyroid: No thyroid mass.  Cardiovascular:     Rate and Rhythm: Normal rate and regular rhythm.  Pulmonary:     Effort: Pulmonary effort is normal.     Breath sounds: Normal breath sounds.  Abdominal:     General: Abdomen is flat. Bowel sounds are normal.     Palpations: Abdomen is soft.  Musculoskeletal:        General: Normal range of motion.  Lymphadenopathy:     Cervical:     Right cervical: No superficial cervical adenopathy.    Left cervical: No superficial cervical adenopathy.  Skin:    General: Skin is warm.     Capillary Refill: Capillary refill takes less than 2 seconds.  Neurological:     General: No focal deficit present.     Mental Status: She is alert and oriented to person, place, and time.  Psychiatric:        Mood and Affect: Mood normal.        Behavior: Behavior normal.        Thought Content: Thought content normal.        Judgment: Judgment normal.       BP 129/82   Pulse 72   Temp 98.6 F (37 C)   Resp 16   Ht 5' 6"  (1.676 m)   Wt 172 lb 2 oz (78.1 kg)   LMP 12/29/2012   SpO2 97%   BMI 27.78 kg/m  Wt Readings from Last 3 Encounters:  01/02/22 172 lb 2 oz (78.1 kg)  04/28/21 178 lb (80.7 kg)  02/15/20 171 lb 9.6 oz (77.8 kg)     Health Maintenance Due  Topic Date Due   HIV Screening  Never done   Hepatitis C Screening  Never done   TETANUS/TDAP  10/01/2019   Zoster Vaccines- Shingrix (2 of 2) 11/14/2021   INFLUENZA VACCINE  12/23/2021    There are no preventive care reminders to display for this  patient.  Lab Results  Component Value Date   TSH 3.17 01/05/2013  Lab Results  Component Value Date   WBC 10.9 (H) 01/05/2013   HGB 13.3 01/05/2013   HCT 38.8 01/05/2013   MCV 88.5 01/05/2013   PLT 362.0 01/05/2013   Lab Results  Component Value Date   NA 143 05/07/2021   K 5.3 (H) 05/07/2021   CO2 23 05/07/2021   GLUCOSE 97 05/07/2021   BUN 14 05/07/2021   CREATININE 0.78 05/07/2021   BILITOT 0.3 05/07/2021   ALKPHOS 57 05/07/2021   AST 14 05/07/2021   ALT 12 05/07/2021   PROT 6.3 05/07/2021   ALBUMIN 4.2 05/07/2021   CALCIUM 9.2 05/07/2021   EGFR 87 05/07/2021   GFR 88.80 01/05/2013   Lab Results  Component Value Date   CHOL 240 (H) 05/07/2021   Lab Results  Component Value Date   HDL 47 05/07/2021   Lab Results  Component Value Date   LDLCALC 175 (H) 05/07/2021   Lab Results  Component Value Date   TRIG 101 05/07/2021   Lab Results  Component Value Date   CHOLHDL 5.1 (H) 05/07/2021   Lab Results  Component Value Date   HGBA1C 6.1 (H) 05/07/2021      Assessment & Plan:   Problem List Items Addressed This Visit       Genitourinary   Genital herpes    Lysine prn controlled        Other   Hypertriglyceridemia    Ordered lipid panel, pending results. Work on low cholesterol diet and exercise as tolerated Consider statin if still high Handout given on low cholesterol diet      Relevant Orders   Lipid panel   Pre-diabetes    Pt advised of the following: Work on a diabetic diet, try to incorporate exercise at least 20-30 a day for 3 days a week or more.        Palpitations - Primary    ekg today in office, NSR.  If ongoing consider referral to cardiology.  Limit caffeine intake.  Ordering potassium repeat, and will order tsh as well to r/o reasons. Ordering cbc to r/o anemia.      Relevant Orders   Lipid panel   CBC with Differential/Platelet   EKG 12-Lead (Completed)   TSH   Serum potassium elevated    Repeat today. Pending  results      Relevant Orders   Comprehensive metabolic panel   EKG 95-ZXYD (Completed)   RESOLVED: Prediabetes   Relevant Orders   Hemoglobin A1c    No orders of the defined types were placed in this encounter.   Follow-up: Return in about 3 months (around 04/04/2022) for regular follow up with labs .    Eugenia Pancoast, FNP

## 2022-01-02 NOTE — Assessment & Plan Note (Signed)
Ordered lipid panel, pending results. Work on low cholesterol diet and exercise as tolerated Consider statin if still high Handout given on low cholesterol diet

## 2022-01-02 NOTE — Assessment & Plan Note (Signed)
Lysine prn controlled

## 2022-04-06 ENCOUNTER — Ambulatory Visit (INDEPENDENT_AMBULATORY_CARE_PROVIDER_SITE_OTHER): Payer: BC Managed Care – PPO | Admitting: Family

## 2022-04-06 ENCOUNTER — Encounter: Payer: Self-pay | Admitting: Family

## 2022-04-06 ENCOUNTER — Ambulatory Visit (INDEPENDENT_AMBULATORY_CARE_PROVIDER_SITE_OTHER)
Admission: RE | Admit: 2022-04-06 | Discharge: 2022-04-06 | Disposition: A | Discharge status: Home / Self Care | Payer: BC Managed Care – PPO | Source: Ambulatory Visit | Attending: Family | Admitting: Family

## 2022-04-06 VITALS — BP 126/84 | HR 76 | Temp 97.9°F | Resp 16 | Ht 66.0 in | Wt 173.5 lb

## 2022-04-06 DIAGNOSIS — R071 Chest pain on breathing: Secondary | ICD-10-CM

## 2022-04-06 DIAGNOSIS — E781 Pure hyperglyceridemia: Secondary | ICD-10-CM | POA: Diagnosis not present

## 2022-04-06 DIAGNOSIS — R002 Palpitations: Secondary | ICD-10-CM

## 2022-04-06 DIAGNOSIS — R079 Chest pain, unspecified: Secondary | ICD-10-CM | POA: Diagnosis not present

## 2022-04-06 DIAGNOSIS — R0602 Shortness of breath: Secondary | ICD-10-CM | POA: Diagnosis not present

## 2022-04-06 DIAGNOSIS — R7303 Prediabetes: Secondary | ICD-10-CM | POA: Diagnosis not present

## 2022-04-06 DIAGNOSIS — Z9189 Other specified personal risk factors, not elsewhere classified: Secondary | ICD-10-CM

## 2022-04-06 DIAGNOSIS — G8929 Other chronic pain: Secondary | ICD-10-CM

## 2022-04-06 LAB — LIPID PANEL
Cholesterol: 204 mg/dL — ABNORMAL HIGH (ref 0–200)
HDL: 45.9 mg/dL (ref >39.00)
LDL Cholesterol: 142 mg/dL — ABNORMAL HIGH (ref 0–99)
NonHDL: 158.42
Total CHOL/HDL Ratio: 4
Triglycerides: 81 mg/dL (ref 0.0–149.0)
VLDL: 16.2 mg/dL (ref 0.0–40.0)

## 2022-04-06 LAB — HEMOGLOBIN A1C: Hgb A1c MFr Bld: 6 % (ref 4.6–6.5)

## 2022-04-06 NOTE — Assessment & Plan Note (Signed)
Repeat lipid panel today pending results Continue to work on low-cholesterol diet, patient provided with handout for low-cholesterol diet Considering calcium score as well we will work with referral coordinator to see if we can get some self-pay options

## 2022-04-06 NOTE — Assessment & Plan Note (Signed)
Ordering chest x-ray to rule out pulmonary etiologies If negative will consider referral to cardiology

## 2022-04-06 NOTE — Progress Notes (Signed)
Established Patient Office Visit  Subjective:  Patient ID: Brittney Park, female    DOB: 1960-08-31  Age: 61 y.o. MRN: [id number]  CC:  Chief Complaint  Patient presents with   Palpitations    Follow up     HPI SALIHA SALTS is here today for follow up.   Pt is without acute concerns.  Palpitations; not so much palpitations as much anymore, more so can find she has difficulty taking in a deep breath and with chest tightness/heaviness at times. This sensation is not necessarily everyday. Doesn't note heartburn, states very rare if she does have this. EKG was NSR in office, considered cardiology referral last visit. She denies increasing pain or sob with exertion.   Maternal grandfather had heart disease and heart attack, unsure of age.   Wt Readings from Last 3 Encounters:  04/06/22 173 lb 8 oz (78.7 kg)  01/02/22 172 lb 2 oz (78.1 kg)  04/28/21 178 lb (80.7 kg)    Prediabetic: not so much of a change to  her diet, but at times she does try to add some vegetables. Admits to rarely exercising.  Lab Results  Component Value Date   HGBA1C 6.2 01/02/2022    Hyperlipidemia:  Lab Results  Component Value Date   CHOL 213 (H) 01/02/2022   HDL 45.10 01/02/2022   LDLCALC 144 (H) 01/02/2022   LDLDIRECT 153.0 01/05/2013   TRIG 122.0 01/02/2022   CHOLHDL 5 01/02/2022        Past Medical History:  Diagnosis Date   Abnormal fasting glucose 01/25/2014   Abnormal Pap smear of cervix mid 90s   D&C   Genital herpes 09/2016   type 2 by IgG   History of Papanicolaou smear of cervix 01/23/2014   -/-   HTN (hypertension)    Hyperlipidemia    Pre-diabetes 02/2014   Vitamin D deficiency     Past Surgical History:  Procedure Laterality Date   DILATION AND CURETTAGE OF UTERUS  MID 90S   VAGINAL DELIVERY     x2    Family History  Problem Relation Age of Onset   Uterine cancer Mother    Liver cancer Father    Retinal detachment Father    Melanoma Brother    Dementia  Maternal Grandmother    Heart disease Maternal Grandfather    Stroke Paternal Grandmother    Breast cancer Neg Hx     Social History   Socioeconomic History   Marital status: Married    Spouse name: Not on file   Number of children: 2   Years of education: Not on file   Highest education level: Not on file  Occupational History   Occupation: Loss adjuster, chartered - Marketing executive: [employer]  Tobacco Use   Smoking status: Never   Smokeless tobacco: Never  Scientific laboratory technician Use: Never used  Substance and Sexual Activity   Alcohol use: Yes    Comment: Occasional   Drug use: No   Sexual activity: Yes    Partners: Male    Birth control/protection: Post-menopausal  Other Topics Concern   Not on file  Social History Narrative   Lives in [place] with husband. One cat.  Work - Retail buyer Exercise -  NODaily Caffeine Use:  1 cup coffee AM   Social Determinants of Radio broadcast assistant Strain: Not on file  Food Insecurity: Not on file  Transportation Needs: Not on file  Physical Activity: Not on file  Stress: Not on file  Social Connections: Not on file  Intimate Partner Violence: Not on file    Outpatient Medications Prior to Visit  Medication Sig Dispense Refill   Calcium Carbonate (CALCIUM 500 PO) Take by mouth.     cholecalciferol (VITAMIN D) 1000 UNITS tablet 1,000 Units daily.     Multiple Vitamins-Minerals (MULTIVITAMIN WITH MINERALS) tablet Take 1 tablet by mouth daily.     Lysine 1000 MG TABS Take by mouth. (Patient not taking: Reported on 04/06/2022)     No facility-administered medications prior to visit.    No Known Allergies        Objective:    Physical Exam Constitutional:      Appearance: Normal appearance.  Cardiovascular:     Rate and Rhythm: Normal rate and regular rhythm. No extrasystoles are present.    Heart sounds: Normal heart sounds. No murmur heard.    Comments: No acrotid  bruits Pulmonary:     Effort: Pulmonary effort is normal.     Breath sounds: Normal breath sounds.  Neurological:     General: No focal deficit present.     Mental Status: She is alert and oriented to person, place, and time. Mental status is at baseline.  Psychiatric:        Mood and Affect: Mood normal.        Behavior: Behavior normal.        Thought Content: Thought content normal.        Judgment: Judgment normal.       BP 126/84   Pulse 76   Temp 97.9 F (36.6 C)   Resp 16   Ht _0  (1.676 m)   Wt 173 lb 8 oz (78.7 kg)   LMP 12/29/2012   SpO2 97%   BMI 28.00 kg/m  Wt Readings from Last 3 Encounters:  04/06/22 173 lb 8 oz (78.7 kg)  01/02/22 172 lb 2 oz (78.1 kg)  04/28/21 178 lb (80.7 kg)     Health Maintenance Due  Topic Date Due   HIV Screening  Never done   Hepatitis C Screening  Never done   TETANUS/TDAP  10/01/2019   COVID-19 Vaccine (3 - Pfizer series) 10/13/2019   PAP SMEAR-Modifier  05/24/2022    There are no preventive care reminders to display for this patient.  Lab Results  Component Value Date   TSH 2.80 01/02/2022   Lab Results  Component Value Date   WBC 7.8 01/02/2022   HGB 14.3 01/02/2022   HCT 43.0 01/02/2022   MCV 88.1 01/02/2022   PLT 284.0 01/02/2022   Lab Results  Component Value Date   NA 140 01/02/2022   K 4.1 01/02/2022   CO2 29 01/02/2022   GLUCOSE 103 (H) 01/02/2022   BUN 13 01/02/2022   CREATININE 0.80 01/02/2022   BILITOT 0.6 01/02/2022   ALKPHOS 53 01/02/2022   AST 15 01/02/2022   ALT 12 01/02/2022   PROT 7.1 01/02/2022   ALBUMIN 4.6 01/02/2022   CALCIUM 9.4 01/02/2022   EGFR 87 05/07/2021   GFR 79.48 01/02/2022   Lab Results  Component Value Date   CHOL 213 (H) 01/02/2022   Lab Results  Component Value Date   HDL 45.10 01/02/2022   Lab Results  Component Value Date   LDLCALC 144 (H) 01/02/2022   Lab Results  Component Value Date   TRIG 122.0 01/02/2022   Lab Results  Component Value Date    CHOLHDL  5 01/02/2022   Lab Results  Component Value Date   HGBA1C 6.2 01/02/2022      Assessment & Plan:   Problem List Items Addressed This Visit       Other   Hypertriglyceridemia    Repeat lipid panel today pending results Continue to work on low-cholesterol diet, patient provided with handout for low-cholesterol diet Considering calcium score as well we will work with referral coordinator to see if we can get some self-pay options      Relevant Orders   Lipid panel   Pre-diabetes    Ordering A1c pending results patient advised to continue to work on diabetic diet and exercise as tolerated      Relevant Orders   Hemoglobin A1c   Palpitations    Improving however chest pain with breathing ongoing.  Work-up in progress      Chronic chest pain with low to moderate risk for CAD    Ordering chest x-ray to rule out pulmonary etiologies If negative will consider referral to cardiology      Chest pain made worse by breathing - Primary   Relevant Orders   DG Chest 2 View    No orders of the defined types were placed in this encounter.   Follow-up: Return in about 6 months (around 10/05/2022) for f/u cholesterol.    Eugenia Pancoast, FNP

## 2022-04-06 NOTE — Assessment & Plan Note (Signed)
Improving however chest pain with breathing ongoing.  Work-up in progress

## 2022-04-06 NOTE — Assessment & Plan Note (Signed)
Ordering A1c pending results patient advised to continue to work on diabetic diet and exercise as tolerated

## 2022-04-07 NOTE — Progress Notes (Signed)
Prediabetes slight improvement from 6.2 down to 6.2 continue with diabetic diet and exercise as tolerated.   Cholesterol still high, about the same from three months ago. Would pt consider medication?

## 2022-04-27 ENCOUNTER — Encounter: Payer: Self-pay | Admitting: Family

## 2022-04-27 DIAGNOSIS — E663 Overweight: Secondary | ICD-10-CM

## 2022-04-27 DIAGNOSIS — E781 Pure hyperglyceridemia: Secondary | ICD-10-CM

## 2022-04-27 DIAGNOSIS — E7800 Pure hypercholesterolemia, unspecified: Secondary | ICD-10-CM

## 2022-04-27 DIAGNOSIS — R7303 Prediabetes: Secondary | ICD-10-CM

## 2022-04-29 ENCOUNTER — Other Ambulatory Visit: Payer: Self-pay | Admitting: Family

## 2022-04-29 DIAGNOSIS — E7800 Pure hypercholesterolemia, unspecified: Secondary | ICD-10-CM | POA: Insufficient documentation

## 2022-04-29 DIAGNOSIS — Z1231 Encounter for screening mammogram for malignant neoplasm of breast: Secondary | ICD-10-CM

## 2022-05-14 ENCOUNTER — Ambulatory Visit
Admission: RE | Admit: 2022-05-14 | Discharge: 2022-05-14 | Disposition: A | Discharge status: Home / Self Care | Payer: BC Managed Care – PPO | Source: Ambulatory Visit | Attending: Family | Admitting: Family

## 2022-05-14 DIAGNOSIS — Z1231 Encounter for screening mammogram for malignant neoplasm of breast: Secondary | ICD-10-CM | POA: Insufficient documentation

## 2022-06-12 ENCOUNTER — Ambulatory Visit (HOSPITAL_BASED_OUTPATIENT_CLINIC_OR_DEPARTMENT_OTHER)
Admission: RE | Admit: 2022-06-12 | Discharge: 2022-06-12 | Disposition: A | Discharge status: Home / Self Care | Payer: BC Managed Care – PPO | Source: Ambulatory Visit | Attending: Family | Admitting: Family

## 2022-06-12 DIAGNOSIS — E7800 Pure hypercholesterolemia, unspecified: Secondary | ICD-10-CM | POA: Insufficient documentation

## 2022-06-12 DIAGNOSIS — E781 Pure hyperglyceridemia: Secondary | ICD-10-CM

## 2022-10-27 IMAGING — MG MM DIGITAL SCREENING BILAT W/ TOMO AND CAD
6 of 10 series · 6 of 30 positions shown · non-contrast
Comparison: Previous exam(s).

CLINICAL DATA: Screening.

EXAM:
DIGITAL SCREENING BILATERAL MAMMOGRAM WITH TOMOSYNTHESIS AND CAD
TECHNIQUE: Bilateral screening digital craniocaudal and mediolateral oblique
mammograms were obtained. Bilateral screening digital breast
tomosynthesis was performed. The images were evaluated with
computer-aided detection.

[R CC synth-2D]
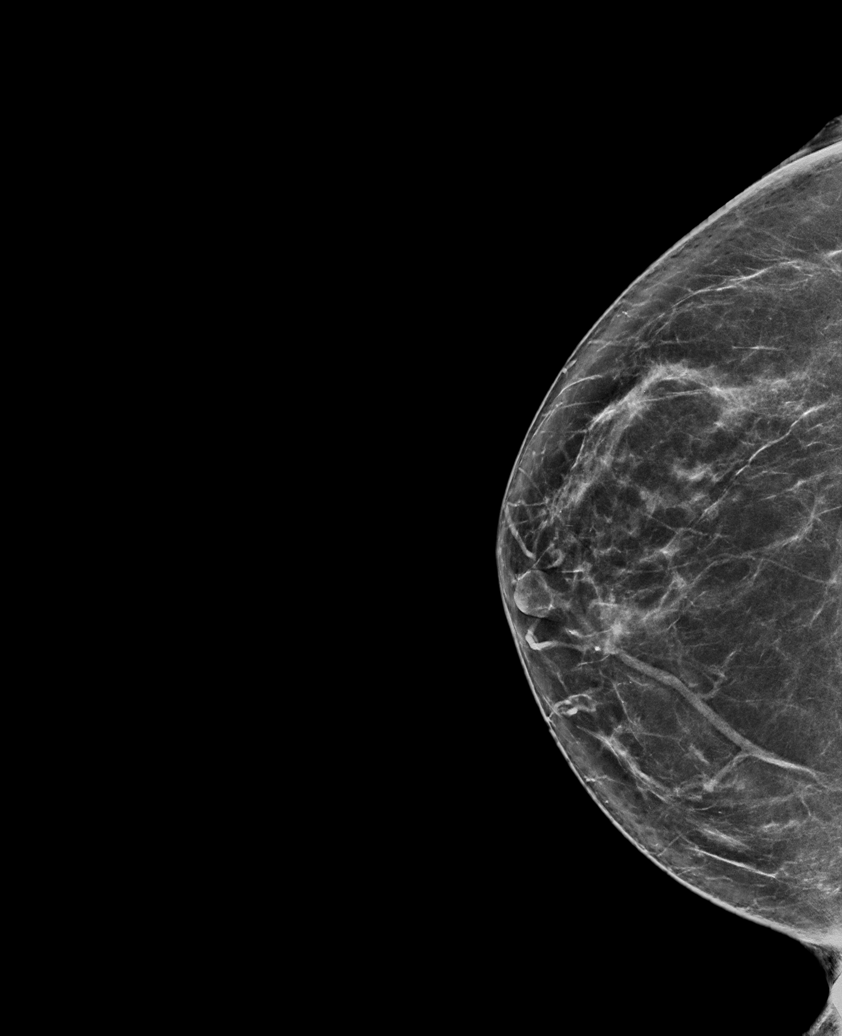

[L MLO synth-2D (1 of 2)]
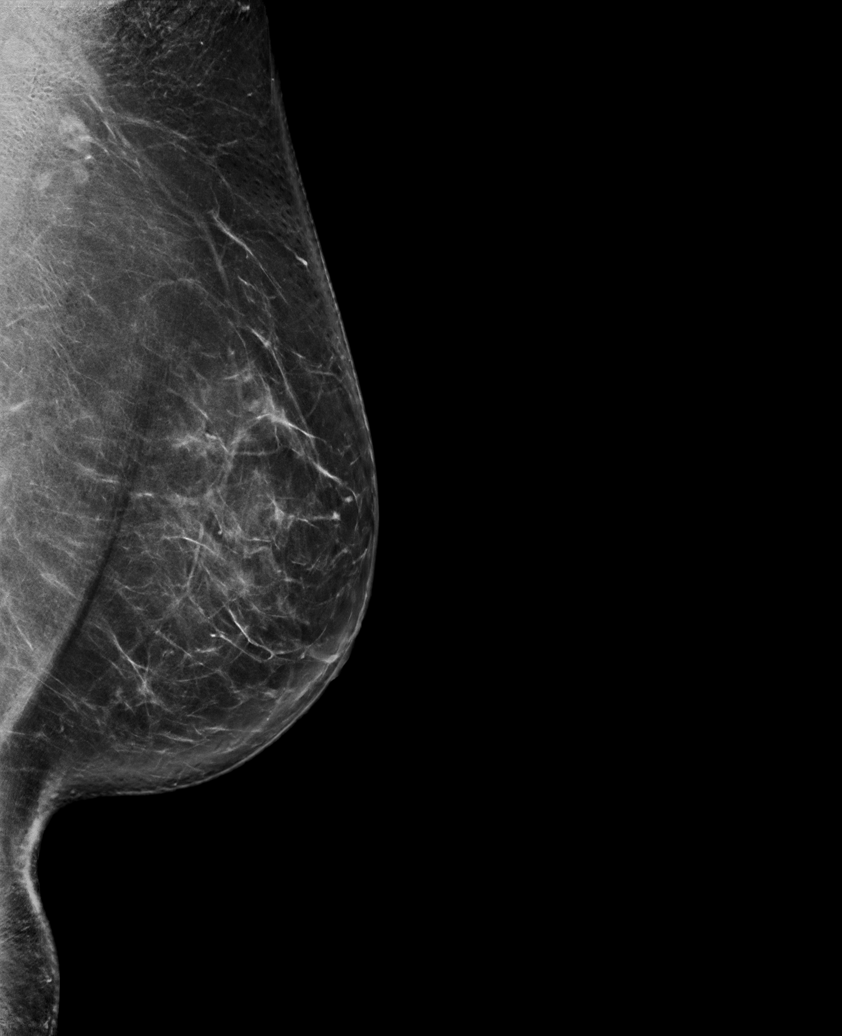

[L MLO synth-2D (2 of 2)]
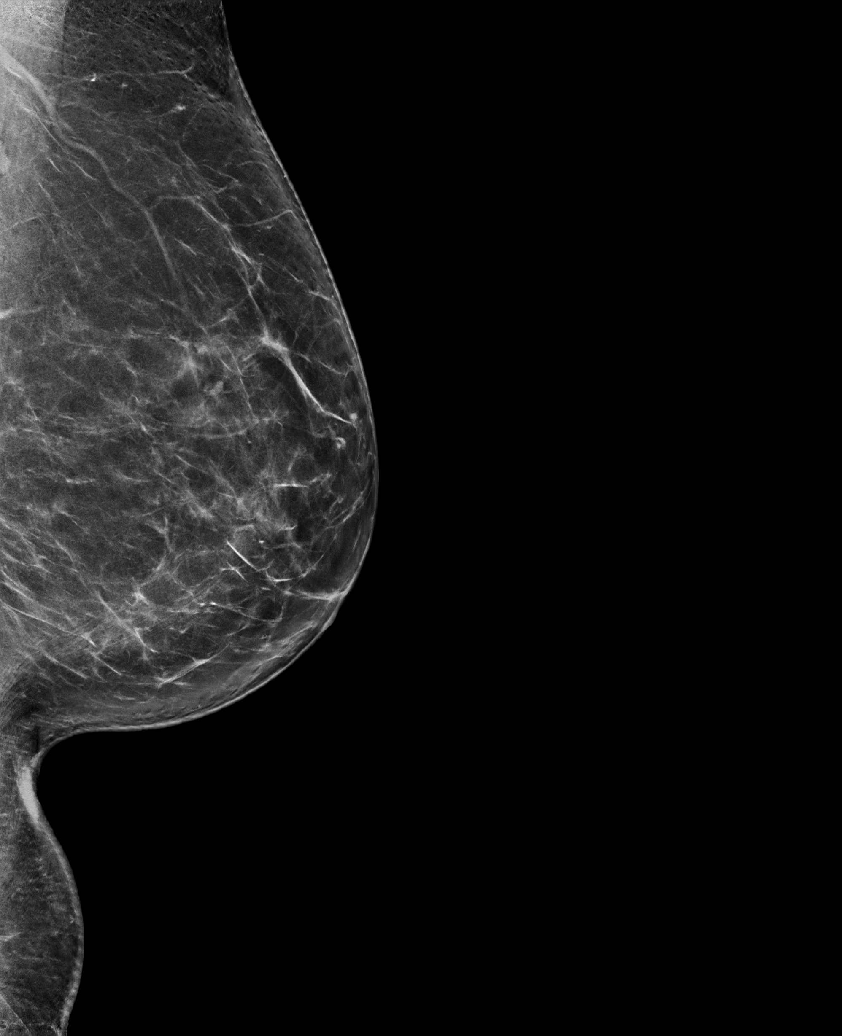

[R MLO synth-2D]
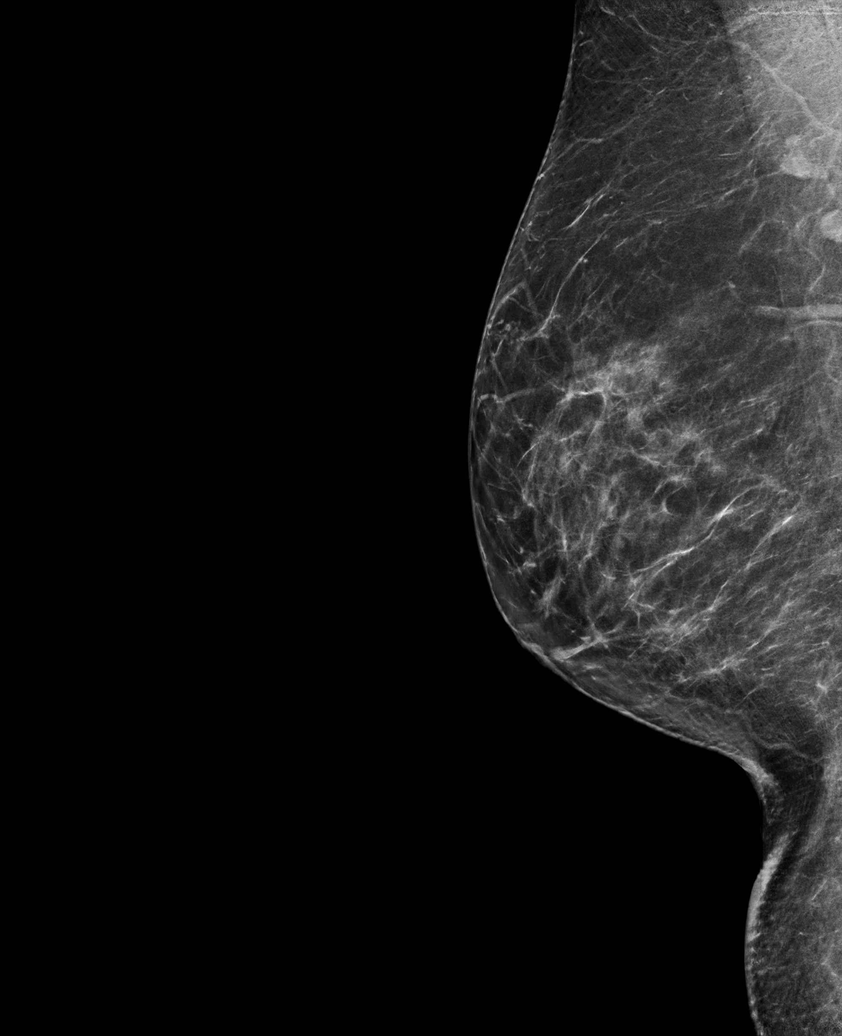

[L CC synth-2D]
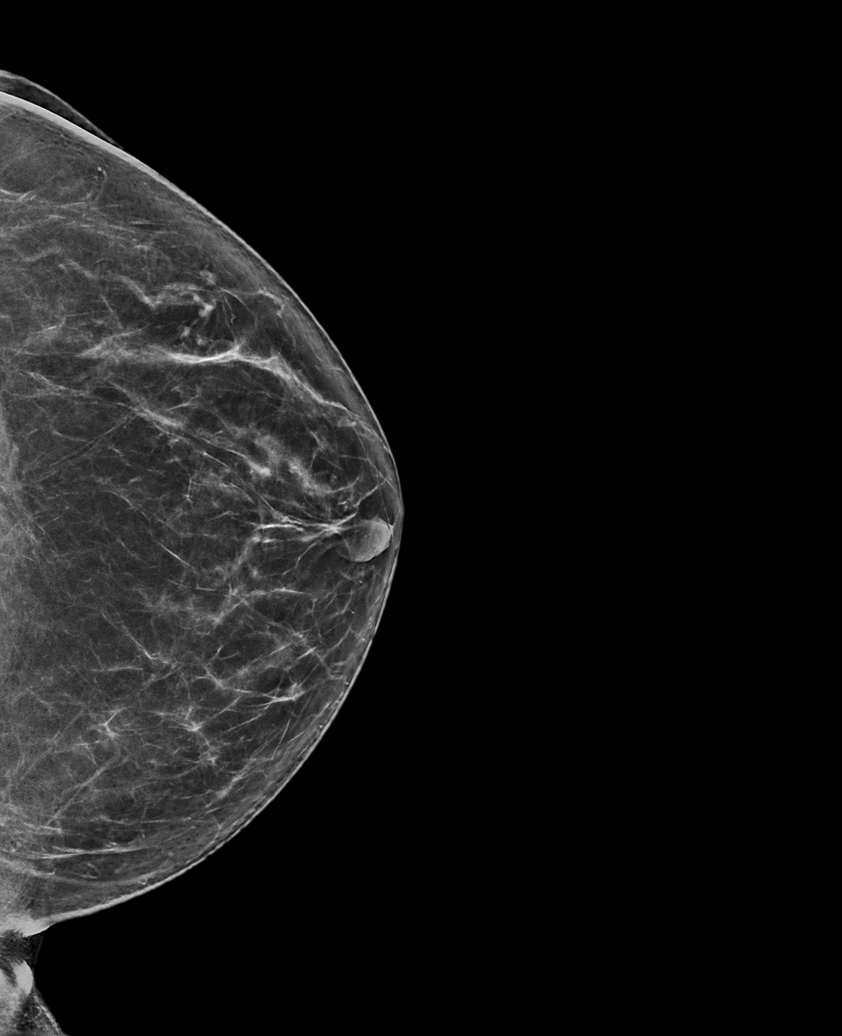

[L MLO tomo · tomo slice 42/83.0]
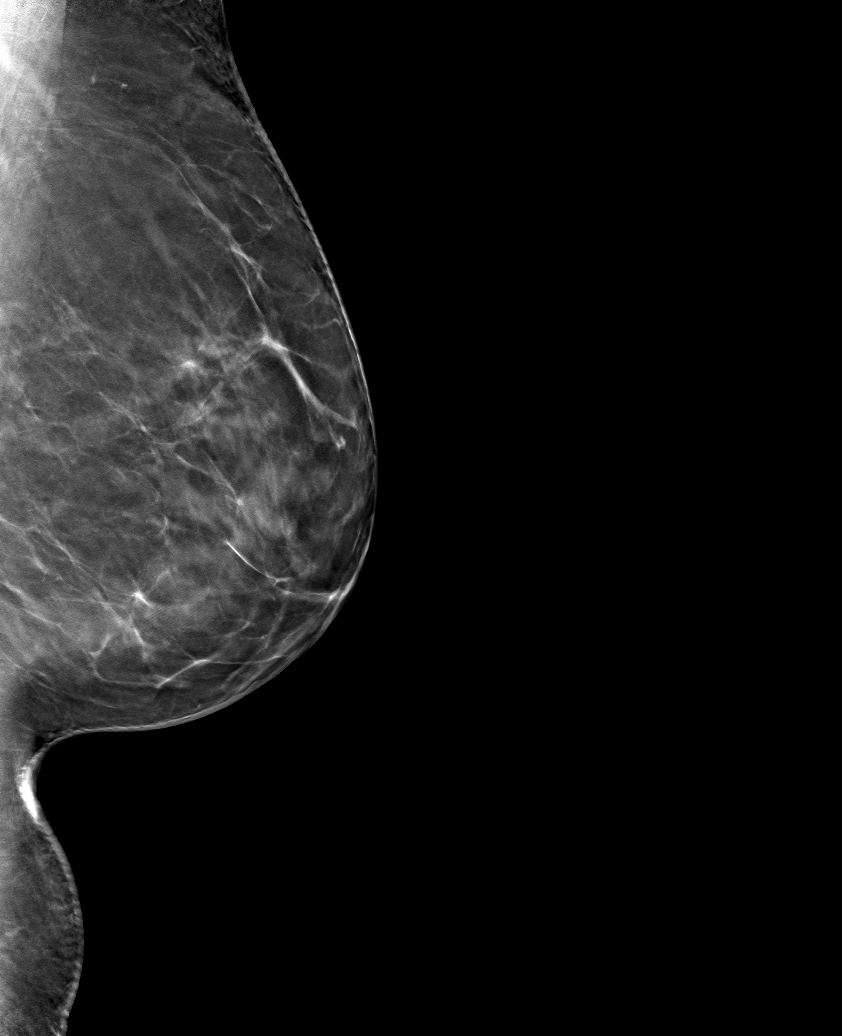

[6 of 30 positions shown; findings below may reference images not displayed]

ACR Breast Density Category b: There are scattered areas of
fibroglandular density.
FINDINGS: There are no findings suspicious for malignancy.
IMPRESSION: No mammographic evidence of malignancy. A result letter of this
screening mammogram will be mailed directly to the patient.

RECOMMENDATION:
Screening mammogram in one year. (Code:51-O-LD2)

BI-RADS CATEGORY  1: Negative.

## 2023-03-21 NOTE — Progress Notes (Unsigned)
PCP: Mort Sawyers, FNP   No chief complaint on file.   HPI:      [name] is a 62 y.o. [id number] who LMP was Patient's last menstrual period was 12/29/2012., presents today for her annual examination.  Her menses are absent and she is postmenopausal. She does not have PMB. She does not have vasomotor sx.   Sex activity: single partner, contraception - post menopausal status. She does have vaginal dryness. Uses lubricants with relief.   Last Pap: 05/24/17  Results were: no abnormalities /neg HPV DNA.  Hx of STDs: HSV lesion 5/18, neg culture but pos HSV 2 IgG.   Last mammogram: 05/14/22 Results were: normal--routine follow-up in 12 months.  There is no FH of breast cancer. There is no FH of ovarian cancer. The patient does not do self-breast exams.  Having issues recently with urge incont first thing in AM. Doesn't get up to void in night. Wears pad during day in case of leakage but sx usually in early AM. Drinks coffee in AM.   Having issues with RT thumb and wrist popping/aching with certain movements, particularly rotation in wrist. No prior trauma, does repetitive use on computer. Hasn't tried ice.   Pt with tinnitus bilat ears recently. Used to have it at night only but now more frequently. No loud noise exposure.   Colonoscopy: colonoscopy 3/23 at [facility]; repeat due after *** yrs;  8 years ago without abnormalities.  Repeat due after 5 years   Tobacco use: The patient denies current or previous tobacco use. Alcohol use: none No drug use. Exercise: not active  She does get adequate calcium and Vitamin D in her diet.  Lipids good 2018. LDL slightly elevated 9/20 and 9/21. Hx of pre-DM. Due for lab check  Past Medical History:  Diagnosis Date   Abnormal fasting glucose 01/25/2014   Abnormal Pap smear of cervix mid 90s   D&C   Genital herpes 09/2016   type 2 by IgG   History of Papanicolaou smear of cervix 01/23/2014   -/-   HTN (hypertension)    Hyperlipidemia     Pre-diabetes 02/2014   Vitamin D deficiency     Past Surgical History:  Procedure Laterality Date   DILATION AND CURETTAGE OF UTERUS  MID 90S   VAGINAL DELIVERY     x2    Family History  Problem Relation Age of Onset   Uterine cancer Mother    Liver cancer Father    Retinal detachment Father    Melanoma Brother    Dementia Maternal Grandmother    Heart disease Maternal Grandfather    Stroke Paternal Grandmother    Breast cancer Neg Hx     Social History   Socioeconomic History   Marital status: Married    Spouse name: Not on file   Number of children: 2   Years of education: Not on file   Highest education level: Not on file  Occupational History   Occupation: Chief of Staff - Chemical engineer: [employer]  Tobacco Use   Smoking status: Never   Smokeless tobacco: Never  Vaping Use   Vaping status: Never Used  Substance and Sexual Activity   Alcohol use: Yes    Comment: Occasional   Drug use: No   Sexual activity: Yes    Partners: Male    Birth control/protection: Post-menopausal  Other Topics Concern   Not on file  Social History Narrative   Lives  in [place] with husband. One cat.  Work - Designer, jewellery Exercise -  NODaily Caffeine Use:  1 cup coffee AM   Social Determinants of Corporate investment banker Strain: Not on file  Food Insecurity: Not on file  Transportation Needs: Not on file  Physical Activity: Not on file  Stress: Not on file  Social Connections: Not on file  Intimate Partner Violence: Not on file    No outpatient medications have been marked as taking for the 03/23/23 encounter (Appointment) with Heidemarie Goodnow, Ilona Sorrel, PA-C.      ROS:  Review of Systems  Constitutional:  Negative for fatigue, fever and unexpected weight change.  HENT:  Positive for tinnitus.   Respiratory:  Negative for cough, shortness of breath and wheezing.   Cardiovascular:  Negative for chest pain, palpitations  and leg swelling.  Gastrointestinal:  Negative for blood in stool, constipation, diarrhea, nausea and vomiting.  Endocrine: Negative for cold intolerance, heat intolerance and polyuria.  Genitourinary:  Positive for urgency. Negative for dyspareunia, dysuria, flank pain, frequency, genital sores, hematuria, menstrual problem, pelvic pain, vaginal bleeding, vaginal discharge and vaginal pain.  Musculoskeletal:  Positive for arthralgias. Negative for back pain, joint swelling and myalgias.  Skin:  Negative for rash.  Neurological:  Negative for dizziness, syncope, light-headedness, numbness and headaches.  Hematological:  Negative for adenopathy.  Psychiatric/Behavioral:  Negative for agitation, confusion, sleep disturbance and suicidal ideas. The patient is not nervous/anxious.      Objective: LMP 12/29/2012    Physical Exam Constitutional:      Appearance: She is well-developed.  Genitourinary:     Vulva normal.     No vaginal discharge, erythema or tenderness.      Right Adnexa: not tender and no mass present.    Left Adnexa: not tender and no mass present.    No cervical motion tenderness or polyp.     Uterus is not enlarged or tender.  Breasts:    Right: No mass, nipple discharge, skin change or tenderness.     Left: No mass, nipple discharge, skin change or tenderness.  Neck:     Thyroid: No thyromegaly.  Cardiovascular:     Rate and Rhythm: Normal rate and regular rhythm.     Heart sounds: Normal heart sounds. No murmur heard. Pulmonary:     Effort: Pulmonary effort is normal.     Breath sounds: Normal breath sounds.  Abdominal:     Palpations: Abdomen is soft.     Tenderness: There is no abdominal tenderness. There is no guarding.  Musculoskeletal:        General: Normal range of motion.     Cervical back: Normal range of motion.  Neurological:     General: No focal deficit present.     Mental Status: She is alert and oriented to person, place, and time.      Cranial Nerves: No cranial nerve deficit.  Skin:    General: Skin is warm and dry.  Psychiatric:        Mood and Affect: Mood normal.        Behavior: Behavior normal.        Thought Content: Thought content normal.        Judgment: Judgment normal.  Vitals reviewed.     Assessment/Plan:  Encounter for annual routine gynecological examination  Encounter for screening mammogram for malignant neoplasm of breast - Plan: MM 3D SCREEN BREAST BILATERAL; pt to sched mammo  Pain of right thumb--question inflammation  vs arthritis. Recommended PCP vs [facility]. Ice in meantime.   Tinnitus of both ears--refer to PCP.   Screening for colon cancer--pt to f/u with [facility] re: colonoscopy. Will do ref prn.   Blood tests for routine general physical examination - Plan: Comprehensive metabolic panel, Lipid panel, Hemoglobin A1c  Screening cholesterol level - Plan: Lipid panel  Screening for diabetes mellitus - Plan: Hemoglobin A1c  Pre-diabetes - Plan: Hemoglobin A1c  Elevated lipids - Plan: Lipid panel  Need for immunization against influenza - Plan: Flu Vaccine QUAD 83mo+IM (Fluarix, Fluzone & Alfiuria Quad PF)     GYN counsel breast self exam, mammography screening, menopause, adequate intake of calcium and vitamin D, diet and exercise    F/U  No follow-ups on file.  Kaizer Dissinger B. Aryav Wimberly, PA-C 03/21/2023 2:57 PM

## 2023-03-23 ENCOUNTER — Other Ambulatory Visit (HOSPITAL_COMMUNITY)
Admission: RE | Admit: 2023-03-23 | Discharge: 2023-03-23 | Disposition: A | Discharge status: Home / Self Care | Payer: BC Managed Care – PPO | Source: Ambulatory Visit | Attending: Obstetrics and Gynecology | Admitting: Obstetrics and Gynecology

## 2023-03-23 ENCOUNTER — Ambulatory Visit (INDEPENDENT_AMBULATORY_CARE_PROVIDER_SITE_OTHER): Payer: BC Managed Care – PPO | Admitting: Obstetrics and Gynecology

## 2023-03-23 ENCOUNTER — Encounter: Payer: Self-pay | Admitting: Obstetrics and Gynecology

## 2023-03-23 VITALS — BP 128/80 | Ht 66.0 in | Wt 173.0 lb

## 2023-03-23 DIAGNOSIS — Z124 Encounter for screening for malignant neoplasm of cervix: Secondary | ICD-10-CM | POA: Insufficient documentation

## 2023-03-23 DIAGNOSIS — Z01419 Encounter for gynecological examination (general) (routine) without abnormal findings: Secondary | ICD-10-CM | POA: Diagnosis not present

## 2023-03-23 DIAGNOSIS — Z1151 Encounter for screening for human papillomavirus (HPV): Secondary | ICD-10-CM | POA: Diagnosis not present

## 2023-03-23 DIAGNOSIS — E785 Hyperlipidemia, unspecified: Secondary | ICD-10-CM

## 2023-03-23 DIAGNOSIS — Z1231 Encounter for screening mammogram for malignant neoplasm of breast: Secondary | ICD-10-CM

## 2023-03-23 DIAGNOSIS — Z0000 Encounter for general adult medical examination without abnormal findings: Secondary | ICD-10-CM

## 2023-03-23 DIAGNOSIS — R7303 Prediabetes: Secondary | ICD-10-CM

## 2023-03-23 DIAGNOSIS — Z1211 Encounter for screening for malignant neoplasm of colon: Secondary | ICD-10-CM

## 2023-03-23 NOTE — Patient Instructions (Addendum)
I value your feedback and you entrusting us with your care. If you get a [facility] patient survey, I would appreciate you taking the time to let us know about your experience today. Thank you!  [facility] ([place])--[phone]  

## 2023-03-25 LAB — CYTOLOGY - PAP
Comment: NEGATIVE
Diagnosis: NEGATIVE
High risk HPV: NEGATIVE

## 2023-05-12 DIAGNOSIS — D225 Melanocytic nevi of trunk: Secondary | ICD-10-CM | POA: Diagnosis not present

## 2023-05-12 DIAGNOSIS — D2272 Melanocytic nevi of left lower limb, including hip: Secondary | ICD-10-CM | POA: Diagnosis not present

## 2023-05-12 DIAGNOSIS — D2261 Melanocytic nevi of right upper limb, including shoulder: Secondary | ICD-10-CM | POA: Diagnosis not present

## 2023-05-12 DIAGNOSIS — D2262 Melanocytic nevi of left upper limb, including shoulder: Secondary | ICD-10-CM | POA: Diagnosis not present

## 2023-05-28 ENCOUNTER — Encounter: Payer: Self-pay | Admitting: Family

## 2023-05-28 ENCOUNTER — Ambulatory Visit (INDEPENDENT_AMBULATORY_CARE_PROVIDER_SITE_OTHER): Payer: BC Managed Care – PPO | Admitting: Family

## 2023-05-28 VITALS — BP 132/82 | HR 82 | Temp 98.4°F | Ht 66.0 in | Wt 172.2 lb

## 2023-05-28 DIAGNOSIS — R350 Frequency of micturition: Secondary | ICD-10-CM | POA: Insufficient documentation

## 2023-05-28 DIAGNOSIS — E781 Pure hyperglyceridemia: Secondary | ICD-10-CM | POA: Diagnosis not present

## 2023-05-28 DIAGNOSIS — R7303 Prediabetes: Secondary | ICD-10-CM

## 2023-05-28 DIAGNOSIS — R002 Palpitations: Secondary | ICD-10-CM

## 2023-05-28 DIAGNOSIS — E663 Overweight: Secondary | ICD-10-CM

## 2023-05-28 DIAGNOSIS — E7800 Pure hypercholesterolemia, unspecified: Secondary | ICD-10-CM

## 2023-05-28 DIAGNOSIS — Z0001 Encounter for general adult medical examination with abnormal findings: Secondary | ICD-10-CM | POA: Insufficient documentation

## 2023-05-28 LAB — LIPID PANEL
Cholesterol: 199 mg/dL (ref 0–200)
HDL: 45.1 mg/dL (ref >39.00)
LDL Cholesterol: 131 mg/dL — ABNORMAL HIGH (ref 0–99)
NonHDL: 154.28
Total CHOL/HDL Ratio: 4
Triglycerides: 117 mg/dL (ref 0.0–149.0)
VLDL: 23.4 mg/dL (ref 0.0–40.0)

## 2023-05-28 LAB — CBC
HCT: 41.1 % (ref 36.0–46.0)
Hemoglobin: 13.8 g/dL (ref 12.0–15.0)
MCHC: 33.6 g/dL (ref 30.0–36.0)
MCV: 88.7 fL (ref 78.0–100.0)
Platelets: 299 10*3/uL (ref 150.0–400.0)
RBC: 4.64 Mil/uL (ref 3.87–5.11)
RDW: 13.5 % (ref 11.5–15.5)
WBC: 8.6 10*3/uL (ref 4.0–10.5)

## 2023-05-28 LAB — BASIC METABOLIC PANEL
BUN: 17 mg/dL (ref 6–23)
CO2: 29 meq/L (ref 19–32)
Calcium: 9.4 mg/dL (ref 8.4–10.5)
Chloride: 104 meq/L (ref 96–112)
Creatinine, Ser: 0.8 mg/dL (ref 0.40–1.20)
GFR: 78.7 mL/min (ref >60.00)
Glucose, Bld: 106 mg/dL — ABNORMAL HIGH (ref 70–99)
Potassium: 4.7 meq/L (ref 3.5–5.1)
Sodium: 141 meq/L (ref 135–145)

## 2023-05-28 LAB — POCT URINALYSIS DIP (CLINITEK)
Bilirubin, UA: NEGATIVE
Glucose, UA: NEGATIVE mg/dL
Ketones, POC UA: NEGATIVE mg/dL
Nitrite, UA: NEGATIVE
POC PROTEIN,UA: >=300 — AB
Spec Grav, UA: 1.01 (ref 1.010–1.025)
Urobilinogen, UA: 0.2 U/dL
pH, UA: 7.5 (ref 5.0–8.0)

## 2023-05-28 LAB — HEMOGLOBIN A1C: Hgb A1c MFr Bld: 6 % (ref 4.6–6.5)

## 2023-05-28 MED ORDER — SULFAMETHOXAZOLE-TRIMETHOPRIM 800-160 MG PO TABS: 1.0000 | ORAL_TABLET | Freq: Two times a day (BID) | ORAL | 0 refills | Status: DC

## 2023-05-28 NOTE — Assessment & Plan Note (Signed)
 Ordered lipid panel, pending results. Work on low cholesterol diet and exercise as tolerated

## 2023-05-28 NOTE — Progress Notes (Signed)
 Subjective:  Patient ID: Brittney Park, female    DOB: 06/14/1960  Age: 63 y.o. MRN: [id number]  Patient Care Team: Corwin Antu, FNP as PCP - General (Family Medicine) Copland, Alicia B, PA-C as Referring Physician (Obstetrics and Gynecology)   CC:  Chief Complaint  Patient presents with   Annual Exam    HPI Brittney Park is a 63 y.o. female who presents today for an annual physical exam. She reports consuming a general diet. The patient does not participate in regular exercise at present. She generally feels fairly well. She reports sleeping well. She does have additional problems to discuss today.   Vision:Within last year Dental:Receives regular dental care  Mammogram: scheduled next week  Last pap: 03/23/23 Colonoscopy: 10/06/21 per pt repeat in ten years.   Pt is with acute concerns.  Seven days ago started with some mild dysuria, dark blood tinged urine, urinary frequency and also urgency. No flank pain and or fever. No abdominal pain. Drinking a good amount of water.     Advanced Directives Patient does have advanced directives. She does not have a copy in the electronic medical record.   DEPRESSION SCREENING    05/28/2023    8:18 AM 04/06/2022    7:51 AM 01/05/2013    8:44 AM  PHQ 2/9 Scores  PHQ - 2 Score 0 0 0  PHQ- 9 Score 0       ROS: Negative unless specifically indicated above in HPI.    Current Outpatient Medications:    Calcium  Carbonate (CALCIUM  500 PO), Take by mouth., Disp: , Rfl:    cholecalciferol (VITAMIN D ) 1000 UNITS tablet, 1,000 Units daily., Disp: , Rfl:    Multiple Vitamins-Minerals (MULTIVITAMIN WITH MINERALS) tablet, Take 1 tablet by mouth daily., Disp: , Rfl:    sulfamethoxazole -trimethoprim  (BACTRIM  DS) 800-160 MG tablet, Take 1 tablet by mouth 2 (two) times daily for 7 days., Disp: 14 tablet, Rfl: 0    Objective:    BP 132/82 (BP Location: Left Arm, Patient Position: Sitting, Cuff Size: Normal)   Pulse 82   Temp 98.4 F (36.9 C)  (Temporal)   Ht 5' 6 (1.676 m)   Wt 172 lb 3.2 oz (78.1 kg)   LMP 12/29/2012   SpO2 95%   BMI 27.79 kg/m   BP Readings from Last 3 Encounters:  05/28/23 132/82  03/23/23 128/80  04/06/22 126/84      Physical Exam Constitutional:      General: She is not in acute distress.    Appearance: Normal appearance. She is normal weight. She is not ill-appearing.  HENT:     Head: Normocephalic.     Right Ear: Tympanic membrane normal.     Left Ear: Tympanic membrane normal.     Nose: Nose normal.     Mouth/Throat:     Mouth: Mucous membranes are moist.  Eyes:     Extraocular Movements: Extraocular movements intact.     Pupils: Pupils are equal, round, and reactive to light.  Cardiovascular:     Rate and Rhythm: Normal rate and regular rhythm.  Pulmonary:     Effort: Pulmonary effort is normal.     Breath sounds: Normal breath sounds.  Abdominal:     General: Abdomen is flat. Bowel sounds are normal.     Palpations: Abdomen is soft.     Tenderness: There is no guarding or rebound.  Musculoskeletal:        General: Normal range of motion.  Cervical back: Normal range of motion.  Skin:    General: Skin is warm.     Capillary Refill: Capillary refill takes less than 2 seconds.  Neurological:     General: No focal deficit present.     Mental Status: She is alert.  Psychiatric:        Mood and Affect: Mood normal.        Behavior: Behavior normal.        Thought Content: Thought content normal.        Judgment: Judgment normal.          Assessment & Plan:  Urinary frequency -     POCT URINALYSIS DIP (CLINITEK) -     Sulfamethoxazole -Trimethoprim ; Take 1 tablet by mouth 2 (two) times daily for 7 days.  Dispense: 14 tablet; Refill: 0 -     Urine Culture  Hypertriglyceridemia Assessment & Plan: Ordered lipid panel, pending results. Work on low cholesterol diet and exercise as tolerated    Palpitations  Encounter for general adult medical examination with  abnormal findings Assessment & Plan: Patient Counseling(The following topics were reviewed):  Preventative care handout given to pt  Health maintenance and immunizations reviewed. Please refer to Health maintenance section. Pt advised on safe sex, wearing seatbelts in car, and proper nutrition labwork ordered today for annual Dental health: Discussed importance of regular tooth brushing, flossing, and dental visits.   Orders: -     Hemoglobin A1c -     Lipid panel -     Basic metabolic panel -     CBC  Elevated LDL cholesterol level Assessment & Plan: Ordered lipid panel, pending results. Work on low cholesterol diet and exercise as tolerated    Overweight (BMI 25.0-29.9) Assessment & Plan: Pt advised to work on diet and exercise as tolerated    Pre-diabetes Assessment & Plan: Pt advised of the following: Work on a diabetic diet, try to incorporate exercise at least 20-30 a day for 3 days a week or more.   A1c ordered pending results       Follow-up: Return in about 1 year (around 05/27/2024) for f/u CPE.   Ginger Patrick, FNP

## 2023-05-28 NOTE — Patient Instructions (Signed)
  Get some over the counter replens to try out for vaginal atrophy.

## 2023-05-28 NOTE — Assessment & Plan Note (Signed)
 Pt advised to work on diet and exercise as tolerated

## 2023-05-28 NOTE — Assessment & Plan Note (Signed)
 Pt advised of the following: Work on a diabetic diet, try to incorporate exercise at least 20-30 a day for 3 days a week or more.  A1c ordered pending results

## 2023-05-28 NOTE — Assessment & Plan Note (Signed)

## 2023-05-30 ENCOUNTER — Encounter: Payer: Self-pay | Admitting: Family

## 2023-05-30 ENCOUNTER — Other Ambulatory Visit: Payer: Self-pay | Admitting: Family

## 2023-05-30 DIAGNOSIS — N390 Urinary tract infection, site not specified: Secondary | ICD-10-CM

## 2023-05-30 DIAGNOSIS — R350 Frequency of micturition: Secondary | ICD-10-CM

## 2023-05-30 LAB — URINE CULTURE
MICRO NUMBER:: 15915069
SPECIMEN QUALITY:: ADEQUATE

## 2023-05-30 MED ORDER — AMOXICILLIN-POT CLAVULANATE 875-125 MG PO TABS: 1.0000 | ORAL_TABLET | Freq: Two times a day (BID) | ORAL | 0 refills | Status: AC

## 2023-06-01 ENCOUNTER — Ambulatory Visit
Admission: RE | Admit: 2023-06-01 | Discharge: 2023-06-01 | Disposition: A | Discharge status: Home / Self Care | Payer: BC Managed Care – PPO | Source: Ambulatory Visit | Attending: Obstetrics and Gynecology | Admitting: Obstetrics and Gynecology

## 2023-06-01 DIAGNOSIS — Z1231 Encounter for screening mammogram for malignant neoplasm of breast: Secondary | ICD-10-CM | POA: Diagnosis not present

## 2023-06-04 ENCOUNTER — Encounter: Payer: Self-pay | Admitting: Obstetrics and Gynecology

## 2024-03-06 ENCOUNTER — Telehealth: Payer: Self-pay | Admitting: Family

## 2024-03-06 DIAGNOSIS — Z780 Asymptomatic menopausal state: Secondary | ICD-10-CM

## 2024-03-06 NOTE — Telephone Encounter (Signed)
Order has been placed. Pt is aware. Nothing further was needed. 

## 2024-03-06 NOTE — Telephone Encounter (Signed)
 Copied from CRM [id number]. Topic: Referral - Request for Referral >> Mar 06, 2024 11:17 AM Anairis L wrote: Did the patient discuss referral with their provider in the last year? No (If No - schedule appointment) (If Yes - send message)  Appointment offered? No  Type of order/referral and detailed reason for visit: Bone density exam   Preference of office, provider, location: [facility]  If referral order, have you been seen by this specialty before? No (If Yes, this issue or another issue? When? Where?  Can we respond through MyChart? Yes

## 2024-03-06 NOTE — Addendum Note (Signed)
 Addended by: ALBINO SHAVER C on: 03/06/2024 03:13 PM   Modules accepted: Orders

## 2024-03-09 ENCOUNTER — Observation Stay (HOSPITAL_COMMUNITY)

## 2024-03-09 ENCOUNTER — Emergency Department (HOSPITAL_COMMUNITY)

## 2024-03-09 ENCOUNTER — Other Ambulatory Visit: Payer: Self-pay

## 2024-03-09 ENCOUNTER — Observation Stay (HOSPITAL_COMMUNITY)
Admission: EM | Admit: 2024-03-09 | Discharge: 2024-03-10 | Disposition: A | Discharge status: Home / Self Care | Attending: Emergency Medicine | Admitting: Emergency Medicine

## 2024-03-09 ENCOUNTER — Encounter (HOSPITAL_COMMUNITY): Payer: Self-pay

## 2024-03-09 DIAGNOSIS — I10 Essential (primary) hypertension: Secondary | ICD-10-CM | POA: Diagnosis not present

## 2024-03-09 DIAGNOSIS — Z7902 Long term (current) use of antithrombotics/antiplatelets: Secondary | ICD-10-CM | POA: Insufficient documentation

## 2024-03-09 DIAGNOSIS — G459 Transient cerebral ischemic attack, unspecified: Secondary | ICD-10-CM | POA: Diagnosis not present

## 2024-03-09 DIAGNOSIS — R202 Paresthesia of skin: Secondary | ICD-10-CM | POA: Diagnosis not present

## 2024-03-09 DIAGNOSIS — R531 Weakness: Secondary | ICD-10-CM | POA: Diagnosis not present

## 2024-03-09 DIAGNOSIS — R29701 NIHSS score 1: Secondary | ICD-10-CM | POA: Diagnosis not present

## 2024-03-09 DIAGNOSIS — Z79899 Other long term (current) drug therapy: Secondary | ICD-10-CM | POA: Diagnosis not present

## 2024-03-09 DIAGNOSIS — I6782 Cerebral ischemia: Secondary | ICD-10-CM | POA: Diagnosis not present

## 2024-03-09 DIAGNOSIS — Z7982 Long term (current) use of aspirin: Secondary | ICD-10-CM | POA: Insufficient documentation

## 2024-03-09 DIAGNOSIS — M5021 Other cervical disc displacement,  high cervical region: Secondary | ICD-10-CM | POA: Diagnosis not present

## 2024-03-09 DIAGNOSIS — M4802 Spinal stenosis, cervical region: Secondary | ICD-10-CM | POA: Diagnosis not present

## 2024-03-09 DIAGNOSIS — I639 Cerebral infarction, unspecified: Secondary | ICD-10-CM | POA: Diagnosis not present

## 2024-03-09 DIAGNOSIS — R29818 Other symptoms and signs involving the nervous system: Secondary | ICD-10-CM | POA: Diagnosis not present

## 2024-03-09 DIAGNOSIS — M47812 Spondylosis without myelopathy or radiculopathy, cervical region: Secondary | ICD-10-CM | POA: Diagnosis not present

## 2024-03-09 DIAGNOSIS — M5023 Other cervical disc displacement, cervicothoracic region: Secondary | ICD-10-CM | POA: Diagnosis not present

## 2024-03-09 DIAGNOSIS — I6521 Occlusion and stenosis of right carotid artery: Secondary | ICD-10-CM | POA: Diagnosis not present

## 2024-03-09 LAB — LIPID PANEL
Cholesterol: 222 mg/dL — ABNORMAL HIGH (ref 0–200)
HDL: 46 mg/dL (ref >40)
LDL Cholesterol: 160 mg/dL — ABNORMAL HIGH (ref 0–99)
Total CHOL/HDL Ratio: 4.8 ratio
Triglycerides: 78 mg/dL (ref <150)
VLDL: 16 mg/dL (ref 0–40)

## 2024-03-09 LAB — HEMOGLOBIN A1C
Hgb A1c MFr Bld: 5.5 % (ref 4.8–5.6)
Mean Plasma Glucose: 111.15 mg/dL

## 2024-03-09 LAB — COMPREHENSIVE METABOLIC PANEL WITH GFR
ALT: 15 U/L (ref 0–44)
AST: 16 U/L (ref 15–41)
Albumin: 4.4 g/dL (ref 3.5–5.0)
Alkaline Phosphatase: 51 U/L (ref 38–126)
Anion gap: 16 — ABNORMAL HIGH (ref 5–15)
BUN: 12 mg/dL (ref 8–23)
CO2: 23 mmol/L (ref 22–32)
Calcium: 9.2 mg/dL (ref 8.9–10.3)
Chloride: 101 mmol/L (ref 98–111)
Creatinine, Ser: 0.73 mg/dL (ref 0.44–1.00)
GFR, Estimated: >60 mL/min (ref >60)
Glucose, Bld: 117 mg/dL — ABNORMAL HIGH (ref 70–99)
Potassium: 3.6 mmol/L (ref 3.5–5.1)
Sodium: 140 mmol/L (ref 135–145)
Total Bilirubin: 0.5 mg/dL (ref 0.0–1.2)
Total Protein: 7.2 g/dL (ref 6.5–8.1)

## 2024-03-09 LAB — I-STAT CHEM 8, ED
BUN: 13 mg/dL (ref 8–23)
Calcium, Ion: 1.11 mmol/L — ABNORMAL LOW (ref 1.15–1.40)
Chloride: 104 mmol/L (ref 98–111)
Creatinine, Ser: 0.8 mg/dL (ref 0.44–1.00)
Glucose, Bld: 112 mg/dL — ABNORMAL HIGH (ref 70–99)
HCT: 41 % (ref 36.0–46.0)
Hemoglobin: 13.9 g/dL (ref 12.0–15.0)
Potassium: 3.5 mmol/L (ref 3.5–5.1)
Sodium: 141 mmol/L (ref 135–145)
TCO2: 24 mmol/L (ref 22–32)

## 2024-03-09 LAB — RAPID URINE DRUG SCREEN, HOSP PERFORMED
Amphetamines: NOT DETECTED
Barbiturates: NOT DETECTED
Benzodiazepines: NOT DETECTED
Cocaine: NOT DETECTED
Opiates: NOT DETECTED
Tetrahydrocannabinol: NOT DETECTED

## 2024-03-09 LAB — CBC
HCT: 43.6 % (ref 36.0–46.0)
Hemoglobin: 14.2 g/dL (ref 12.0–15.0)
MCH: 29 pg (ref 26.0–34.0)
MCHC: 32.6 g/dL (ref 30.0–36.0)
MCV: 89.2 fL (ref 80.0–100.0)
Platelets: 316 K/uL (ref 150–400)
RBC: 4.89 MIL/uL (ref 3.87–5.11)
RDW: 13.2 % (ref 11.5–15.5)
WBC: 10.8 K/uL — ABNORMAL HIGH (ref 4.0–10.5)
nRBC: 0 % (ref 0.0–0.2)

## 2024-03-09 LAB — ETHANOL: Alcohol, Ethyl (B): <15 mg/dL (ref <15)

## 2024-03-09 LAB — PROTIME-INR
INR: 0.9 (ref 0.8–1.2)
Prothrombin Time: 13.1 s (ref 11.4–15.2)

## 2024-03-09 LAB — DIFFERENTIAL
Abs Immature Granulocytes: 0.02 K/uL (ref 0.00–0.07)
Basophils Absolute: 0.1 K/uL (ref 0.0–0.1)
Basophils Relative: 1 %
Eosinophils Absolute: 0.2 K/uL (ref 0.0–0.5)
Eosinophils Relative: 2 %
Immature Granulocytes: 0 %
Lymphocytes Relative: 33 %
Lymphs Abs: 3.6 K/uL (ref 0.7–4.0)
Monocytes Absolute: 0.9 K/uL (ref 0.1–1.0)
Monocytes Relative: 8 %
Neutro Abs: 6 K/uL (ref 1.7–7.7)
Neutrophils Relative %: 56 %

## 2024-03-09 LAB — VITAMIN B12: Vitamin B-12: 468 pg/mL (ref 180–914)

## 2024-03-09 LAB — TROPONIN I (HIGH SENSITIVITY): Troponin I (High Sensitivity): 3 ng/L (ref <18)

## 2024-03-09 LAB — CBG MONITORING, ED: Glucose-Capillary: 117 mg/dL — ABNORMAL HIGH (ref 70–99)

## 2024-03-09 LAB — MAGNESIUM: Magnesium: 2 mg/dL (ref 1.7–2.4)

## 2024-03-09 LAB — APTT: aPTT: 27 s (ref 24–36)

## 2024-03-09 LAB — TSH: TSH: 3.973 u[IU]/mL (ref 0.350–4.500)

## 2024-03-09 MED ORDER — ASPIRIN 81 MG PO TBEC
81.0000 mg | DELAYED_RELEASE_TABLET | Freq: Every day | ORAL | Status: AC
Start: 2024-03-10 — End: ?
  Administered 2024-03-10: 81 mg via ORAL
  Filled 2024-03-09: qty 1

## 2024-03-09 MED ORDER — STROKE: EARLY STAGES OF RECOVERY BOOK: Freq: Once | Status: DC

## 2024-03-09 MED ORDER — IOHEXOL 350 MG/ML SOLN
75.0000 mL | Freq: Once | INTRAVENOUS | Status: AC | PRN
  Administered 2024-03-09: 75 mL via INTRAVENOUS

## 2024-03-09 MED ORDER — ACETAMINOPHEN 160 MG/5ML PO SOLN: 650.0000 mg | ORAL | Status: DC | PRN

## 2024-03-09 MED ORDER — HEPARIN SODIUM (PORCINE) 5000 UNIT/ML IJ SOLN
5000.0000 [IU] | Freq: Three times a day (TID) | INTRAMUSCULAR | Status: DC
  Administered 2024-03-09 – 2024-03-10 (×2): 5000 [IU] via SUBCUTANEOUS
  Filled 2024-03-09 (×2): qty 1

## 2024-03-09 MED ORDER — SODIUM CHLORIDE 0.9 % IV SOLN: INTRAVENOUS | Status: DC

## 2024-03-09 MED ORDER — SODIUM CHLORIDE 0.9% FLUSH
3.0000 mL | Freq: Once | INTRAVENOUS | Status: AC
  Administered 2024-03-09: 3 mL via INTRAVENOUS

## 2024-03-09 MED ORDER — ASPIRIN 325 MG PO TABS
325.0000 mg | ORAL_TABLET | Freq: Once | ORAL | Status: AC
  Administered 2024-03-09: 325 mg via ORAL
  Filled 2024-03-09: qty 1

## 2024-03-09 MED ORDER — ACETAMINOPHEN 325 MG PO TABS: 650.0000 mg | ORAL_TABLET | ORAL | Status: DC | PRN

## 2024-03-09 MED ORDER — ACETAMINOPHEN 650 MG RE SUPP: 650.0000 mg | RECTAL | Status: DC | PRN

## 2024-03-09 MED ORDER — CLOPIDOGREL BISULFATE 300 MG PO TABS
300.0000 mg | ORAL_TABLET | Freq: Once | ORAL | Status: AC
  Administered 2024-03-09: 300 mg via ORAL
  Filled 2024-03-09: qty 1

## 2024-03-09 MED ORDER — CLOPIDOGREL BISULFATE 75 MG PO TABS
75.0000 mg | ORAL_TABLET | Freq: Every day | ORAL | Status: DC
  Administered 2024-03-10: 75 mg via ORAL
  Filled 2024-03-09: qty 1

## 2024-03-09 MED ORDER — ATORVASTATIN CALCIUM 40 MG PO TABS
40.0000 mg | ORAL_TABLET | Freq: Every day | ORAL | Status: AC
Start: 2024-03-10 — End: ?
  Administered 2024-03-10: 40 mg via ORAL
  Filled 2024-03-09: qty 1

## 2024-03-09 NOTE — Consult Note (Signed)
 NEUROLOGY CONSULT NOTE   Date of service: March 09, 2024 Patient Name: Brittney Park MRN:  [id number] DOB:  1961/02/08 Chief Complaint: left arm heaviness, left cheek tingling Requesting Provider: Randol Simmonds, MD  History of Present Illness  Brittney Park is a 63 y.o. female with hx of hypertension and hyperlipidemia who presented to the ED today from work with complaints of sudden onset of left arm heaviness in addition to left facial tingling.  Patient states that the left arm heaviness resolved after a few minutes but still feels an off sensation.  She had tingling of the left face as well that on exam had improved some but she states she still has sensory disturbance described as warmness on the left face that is persistent.  Patient denies any associated left upper extremity weakness.  Patient is left-handed.  LKW: 15:00 Modified rankin score: 0-Completely asymptomatic and back to baseline post- stroke IV Thrombolysis: No, symptoms are felt too mild to treat EVT: No, presentation is not consistent with an LVO  NIHSS components Score: Comment  1a Level of Conscious 0[x]  1[]  2[]  3[]      1b LOC Questions 0[x]  1[]  2[]       1c LOC Commands 0[x]  1[]  2[]       2 Best Gaze 0[x]  1[]  2[]       3 Visual 0[x]  1[]  2[]  3[]      4 Facial Palsy 0[x]  1[]  2[]  3[]      5a Motor Arm - left 0[x]  1[]  2[]  3[]  4[]  UN[]    5b Motor Arm - Right 0[x]  1[]  2[]  3[]  4[]  UN[]    6a Motor Leg - Left 0[x]  1[]  2[]  3[]  4[]  UN[]    6b Motor Leg - Right 0[x]  1[]  2[]  3[]  4[]  UN[]    7 Limb Ataxia 0[x]  1[]  2[]  UN[]      8 Sensory 0[]  1[x]  2[]  UN[]      9 Best Language 0[x]  1[]  2[]  3[]      10 Dysarthria 0[x]  1[]  2[]  UN[]      11 Extinct. and Inattention 0[x]  1[]  2[]       TOTAL: 1    ROS  Comprehensive ROS performed and pertinent positives documented in HPI   Past History   Past Medical History:  Diagnosis Date   Abnormal fasting glucose 01/25/2014   Abnormal Pap smear of cervix mid 90s   D&C   Genital herpes 09/2016    type 2 by IgG   History of Papanicolaou smear of cervix 01/23/2014   -/-   HTN (hypertension)    Hyperlipidemia    Pre-diabetes 02/2014   Vitamin D  deficiency     Past Surgical History:  Procedure Laterality Date   DILATION AND CURETTAGE OF UTERUS  MID 90S   VAGINAL DELIVERY     x2    Family History: Family History  Problem Relation Age of Onset   Uterine cancer Mother    Liver cancer Father    Retinal detachment Father    Melanoma Brother    Dementia Maternal Grandmother    Heart disease Maternal Grandfather    Stroke Paternal Grandmother    Breast cancer Neg Hx    Social History  reports that she has never smoked. She has never used smokeless tobacco. She reports current alcohol use. She reports that she does not use drugs.  No Known Allergies  Medications   Current Facility-Administered Medications:    sodium chloride flush (NS) 0.9 % injection 3 mL, 3 mL, Intravenous, Once, Neysa Caron PARAS, DO  Current Outpatient Medications:  Calcium  Carbonate (CALCIUM  500 PO), Take by mouth., Disp: , Rfl:    cholecalciferol (VITAMIN D ) 1000 UNITS tablet, 1,000 Units daily., Disp: , Rfl:    Multiple Vitamins-Minerals (MULTIVITAMIN WITH MINERALS) tablet, Take 1 tablet by mouth daily., Disp: , Rfl:   Vitals   Vitals:   2024/03/13 1608 2024-03-13 1610  BP:  (!) 177/101  Pulse:  (!) 112  Resp:  18  Temp:  98.2 F (36.8 C)  TempSrc:  Oral  SpO2:  97%  Weight: 78 kg   Height: 5' 6 (1.676 m)     Body mass index is 27.76 kg/m.  Physical Exam   Constitutional: Appears well-developed and well-nourished.  Psych: Affect appropriate to situation. Patient is calm and cooperative with exam Eyes: No scleral injection.  HENT: No OP obstruction.  Head: Normocephalic.  Cardiovascular: Normal rate and regular rhythm.  Respiratory: Effort normal, non-labored breathing on room air GI: Soft.  No distension. There is no tenderness.  Skin: WDI.   Neurologic Examination  Mental  Status: Patient is awake, alert, oriented to person, place, month, year, and situation. Patient is able to give a clear and coherent history. No signs of aphasia or neglect Cranial Nerves: II: Visual Fields are full. Pupils are equal, round, and reactive to light.   III,IV, VI: EOMI without ptosis or diploplia.  V: Warmth sensation reported to light touch on the left cheek compared to the right VII: Facial movement is symmetric resting and with movement VIII: Hearing is intact to voice X: Palate elevates symmetrically XI: Shoulder shrug is symmetric. XII: Tongue protrudes midline  Motor: Tone is normal. Bulk is normal. 5/5 strength was present in all four extremities.  Sensory: Sensation is symmetric to light touch to the lower extremities. Patient reports a nonspecific off sensation to the left upper extremity compared to the right. No extinction to DSS present.  Plantars: Toes are downgoing bilaterally.  Cerebellar: FNF and HKS are intact bilaterally  Labs/Imaging/Neurodiagnostic studies   CBC:  Recent Labs  Lab 13-Mar-2024 1611 2024-03-13 1617  WBC 10.8*  --   NEUTROABS 6.0  --   HGB 14.2 13.9  HCT 43.6 41.0  MCV 89.2  --   PLT 316  --    Basic Metabolic Panel:  Lab Results  Component Value Date   NA 141 2024/03/13   K 3.5 03/13/2024   CO2 29 05/28/2023   GLUCOSE 112 (H) March 13, 2024   BUN 13 Mar 13, 2024   CREATININE 0.80 2024/03/13   CALCIUM  9.4 05/28/2023   GFRNONAA 97 02/21/2020   GFRAA 112 02/21/2020   Lipid Panel:  Lab Results  Component Value Date   LDLCALC 131 (H) 05/28/2023   HgbA1c:  Lab Results  Component Value Date   HGBA1C 6.0 05/28/2023   Urine Drug Screen: No results found for: LABOPIA, COCAINSCRNUR, LABBENZ, AMPHETMU, THCU, LABBARB  Alcohol Level No results found for: [facility] INR  Lab Results  Component Value Date   INR 0.9 13-Mar-2024   APTT  Lab Results  Component Value Date   APTT 27 03/13/2024   CT Head without  contrast(Personally reviewed): 1. No acute intracranial abnormality. 2. ASPECT score is 10. 3. Findings messaged to Dr. Sallyann at 4:28PM on 03-13-24.  CTA head/neck w/wo (Personally reviewed): No LVO or significant stenosis, aneurysm, dissection.  ASSESSMENT   Brittney Park is a 63 y.o. left-handed female with PMHx of hypertension and hyperlipidemia who presented to the ED from work for evaluation of acute onset of left arm heaviness without  weakness and left face tingling.  The heaviness resolved within a few minutes of onset with subsequent off sensation but remains without weakness on neurology evaluation.  Presentation is felt to be consistent with small acute ischemic stroke versus TIA. Will complete stroke/TIA work up. TNK not administered as symptoms are felt to be too mild to treat with subjective warmth sensation to the left cheek and subjective slightly off sensation to the left arm on neurology evaluation.   RECOMMENDATIONS  - MRI brain wo contrast - HgbA1c, fasting lipid panel - Frequent neuro checks - Permissive hypertension, treat SBP > 220 mmHg - Echocardiogram - Prophylactic therapy-Antiplatelet med: Aspirin - dose 325mg  PO or 300mg  PR followed by 81 mg PO daily in addition to clopidogrel 300 mg once followed by 75 mg PO daily (duration per stroke team) - Risk factor modification - Telemetry monitoring - PT/OT as appropriate  - Stroke team to follow ______________________________________________________________________  Signed,  Mimi LELON Ny, NP [facility]  NEUROHOSPITALIST ADDENDUM Performed a face to face diagnostic evaluation.   I have reviewed the contents of history and physical exam as documented by PA/ARNP/Resident and agree with above documentation.  I have discussed and formulated the above plan as documented. Edits to the note have been made as needed.  Alicha Raspberry, MD [facility]

## 2024-03-09 NOTE — ED Provider Triage Note (Signed)
 Emergency Medicine Provider Triage Evaluation Note  Brittney Park , a 63 y.o. female  was evaluated in triage.  Pt complains of left arm weakness and left facial tingling.  Symptoms started an hour ago abrupt onset of left lower face tingling as well as left arm heaviness.  Arm heaviness has improved, but continues to have tingling in her face.  Review of Systems  Positive: Weakness and parasthesia  Negative: Dysarthria, HA  Physical Exam  BP (!) 177/101 (BP Location: Right Arm)   Pulse (!) 112   Temp 98.2 F (36.8 C) (Oral)   Resp 18   Ht 5' 6 (1.676 m)   Wt 78 kg   LMP 12/29/2012   SpO2 97%   BMI 27.76 kg/m  Gen:   Awake, no distress   Resp:  Normal effort  MSK:   Moves extremities without difficulty  Other:  No CN deficits   Medical Decision Making  Medically screening exam initiated at 4:14 PM.  Appropriate orders placed.  Brittney Park was informed that the remainder of the evaluation will be completed by another provider, this initial triage assessment does not replace that evaluation, and the importance of remaining in the ED until their evaluation is complete.  Patient with stroke symptoms.  They are seemingly rapidly improving.  However continues to have numbness to her face.  Nurses instructed to activate code stroke.   Brittney Caron PARAS, DO 03/09/24 1615

## 2024-03-09 NOTE — ED Provider Notes (Signed)
 [place] [facility] AT [facility] Provider Note   CSN: [id number] Arrival date & time: 03/09/24  [id number]     Patient presents with: No chief complaint on file.   Brittney Park is a 63 y.o. female.  HPI Patient is a 63 year old female with PMH of HTN and HLD presenting to the ED with chief complaint of left facial sensation changes and LUE weakness/sensation changes beginning ~15:00 today while at work.  Patient reports that she began to experience the symptoms, which spontaneously resolved in her left arm within <5 minutes, accompanied by a residual sensation of left arm has heaviness, that also subsequently resolved.  He reports that upon arrival, she is still appreciating left facial warmth.  She denies any difficulty speaking or swallowing.  No history of similar symptoms.  No recent headaches, vision changes, neck pain, chest pain, shortness of breath, cough, congestion, fevers, chills, nausea, vomiting, changes in urination, or changes in bowel movements.  Prior to Admission medications   Medication Sig Start Date End Date Taking? Authorizing Provider  Calcium  Carbonate (CALCIUM  500 PO) Take by mouth.    [provider]  cholecalciferol (VITAMIN D ) 1000 UNITS tablet 1,000 Units daily.    [provider]  Multiple Vitamins-Minerals (MULTIVITAMIN WITH MINERALS) tablet Take 1 tablet by mouth daily.    [provider]    Allergies: Patient has no known allergies.    Review of Systems  Updated Vital Signs BP (!) 169/96   Pulse (!) 106   Temp 98.2 F (36.8 C) (Oral)   Resp (!) 23   Ht 5' 6 (1.676 m)   Wt 78 kg   LMP 12/29/2012   SpO2 97%   BMI 27.76 kg/m   Physical Exam Constitutional:      Appearance: Normal appearance.  HENT:     Head: Normocephalic and atraumatic.     Right Ear: Ear canal and external ear normal.     Left Ear: Ear canal and external ear normal.     Nose: Nose normal.     Mouth/Throat:     Mouth:  Mucous membranes are moist.     Pharynx: Oropharynx is clear.  Eyes:     General: No visual field deficit.    Extraocular Movements: Extraocular movements intact.     Conjunctiva/sclera: Conjunctivae normal.     Pupils: Pupils are equal, round, and reactive to light.  Cardiovascular:     Rate and Rhythm: Normal rate and regular rhythm.     Heart sounds: Normal heart sounds.  Pulmonary:     Effort: Pulmonary effort is normal.     Breath sounds: Normal breath sounds.  Abdominal:     General: Abdomen is flat.     Palpations: Abdomen is soft.  Musculoskeletal:        General: Normal range of motion.     Cervical back: Normal range of motion.  Skin:    General: Skin is warm and dry.     Capillary Refill: Capillary refill takes less than 2 seconds.  Neurological:     General: No focal deficit present.     Mental Status: She is alert and oriented to person, place, and time.     GCS: GCS eye subscore is 4. GCS verbal subscore is 5. GCS motor subscore is 6.     Cranial Nerves: Cranial nerves 2-12 are intact. No dysarthria or facial asymmetry.     Coordination: Coordination is intact. Finger-Nose-Finger Test and Heel to [employer]  normal.     Comments: Subjective facial warmth      (all labs ordered are listed, but only abnormal results are displayed) Labs Reviewed  COMPREHENSIVE METABOLIC PANEL WITH GFR - Abnormal; Notable for the following components:      Result Value   Glucose, Bld 117 (*)    Anion gap 16 (*)    All other components within normal limits  CBC - Abnormal; Notable for the following components:   WBC 10.8 (*)    All other components within normal limits  CBG MONITORING, ED - Abnormal; Notable for the following components:   Glucose-Capillary 117 (*)    All other components within normal limits  I-STAT CHEM 8, ED - Abnormal; Notable for the following components:   Glucose, Bld 112 (*)    Calcium , Ion 1.11 (*)    All other components within normal limits   PROTIME-INR  APTT  DIFFERENTIAL  ETHANOL  LIPID PANEL  HEMOGLOBIN A1C  D-DIMER, QUANTITATIVE  MAGNESIUM  CBG MONITORING, ED  TROPONIN I (HIGH SENSITIVITY)    EKG: EKG Interpretation Date/Time:  Thursday March 09 2024 16:35:08 EDT Ventricular Rate:  89 PR Interval:  150 QRS Duration:  83 QT Interval:  375 QTC Calculation: 457 R Axis:   25  Text Interpretation: Sinus rhythm Confirmed by Randol Simmonds [phone]) on 03/09/2024 4:57:17 PM  Radiology: CT HEAD CODE STROKE WO CONTRAST Result Date: 03/09/2024 EXAM: CT HEAD WITHOUT CONTRAST 03/09/2024 04:22:43 PM TECHNIQUE: CT of the head was performed without the administration of intravenous contrast. Automated exposure control, iterative reconstruction, and/or weight based adjustment of the mA/kV was utilized to reduce the radiation dose to as low as reasonably achievable. COMPARISON: None available. CLINICAL HISTORY: Neuro deficit, acute, stroke suspected. Code stroke. Dr. Sallyann [phone]; Heaviness, numb in left arm. LKW 1515. FINDINGS: BRAIN AND VENTRICLES: No acute hemorrhage. No evidence of acute infarct. No hydrocephalus. No extra-axial collection. No mass effect or midline shift. ORBITS: No acute abnormality. SINUSES: Scattered mucosal thickening in the ethmoid and maxillary sinuses. SOFT TISSUES AND SKULL: No acute soft tissue abnormality. No skull fracture. [place] stroke program early CT (aspect) score: Ganglionic (caudate, ic, lentiform nucleus, insula, M1-m3): 7 Supraganglionic (m4-m6): 3 Total: 10 IMPRESSION: 1. No acute intracranial abnormality. 2. ASPECT score is 10. 3. Findings messaged to Dr. Sallyann at 4:28PM on 03/09/24. Electronically signed by: Donnice Mania MD 03/09/2024 04:28 PM EDT RP Workstation: [id number]     Procedures   Medications Ordered in the ED  aspirin tablet 325 mg (325 mg Oral Given 03/09/24 1752)    Followed by  aspirin EC tablet 81 mg (has no administration in time range)  clopidogrel (PLAVIX) tablet 300 mg  (300 mg Oral Given 03/09/24 1752)    Followed by  clopidogrel (PLAVIX) tablet 75 mg (has no administration in time range)  sodium chloride flush (NS) 0.9 % injection 3 mL (3 mLs Intravenous Given 03/09/24 1752)                                    Medical Decision Making Amount and/or Complexity of Data Reviewed Labs: ordered.  Patient is a 63 year old female PMH of HTN and HLD as above presenting to the ED as a code stroke in the setting of left facial sensation change accompanied by L UE sensation change/sensation of heaviness, with extremity changes spontaneously revolving prior to arrival.  LKN 1500.  POC glucose within normal limits.  On arrival, patient activated as a code stroke.  Initially hypertensive 177/101.  Afebrile.  Sinus tachycardia that resolved upon arrival.  No tachypnea.  Saturate 97% on RA.  GCS 15.  Neurologic examination significant for subjective left facial warmth/sensation changes.  CT head without without evidence of acute intracranial abnormality.  ABCD^2 score 4, indicative of moderate risk.  EKG reviewed indicative of normal sinus rhythm with normal intervals and no evidence of preexcitation.  No STEMI.  Patient denying chest pain or anginal equivalents.  EtOH undetectable.  The Bolick panel without evidence of acute electrolytic derangement or renal impairment/AKI.  CBC with stable cell lines.  Mild leukocytosis noted, WBC 10.8 -notably, patient reporting dry nonproductive cough x 1 day without accompanying infectious/systemic symptoms.  Hospitalist service engaged for admission.  To be admitted for ongoing TIA medical workup.  Patient and husband updated at bedside.  Final diagnoses:  TIA (transient ischemic attack)    ED Discharge Orders     None          Antionetta Ator, Elsie, MD 03/09/24 1800    Randol Simmonds, MD 03/11/24 [phone]

## 2024-03-09 NOTE — ED Notes (Signed)
 Pt ambulated to restroom with a stable gait, denies any dizziness or weakness

## 2024-03-09 NOTE — ED Notes (Signed)
 Extra Blue, SST & DG drawn

## 2024-03-09 NOTE — ED Triage Notes (Signed)
 C/O left sided numbness/heaviness that comes and goes that started an hour ago. Denies HA, blurred vision/ headaches.

## 2024-03-09 NOTE — H&P (Signed)
 History and Physical    Patient: Brittney Park FMW:[id number] DOB: February 02, 1961 DOA: 03/09/2024 DOS: the patient was seen and examined on 03/09/2024 . PCP: Brittney Antu, FNP  Patient coming from: Home Chief complaint: Left UE numbness/ heaviness.  HPI:  Brittney Park is a 63 y.o. female with no significant past medical history except for htn, hyperlipidemia presenting today for left facial warmth and left upper extremity heaviness and numbness. Pt was at her desk at work she got up moved around and moved her upper arm and it did resolved and recurred. Initially lasted about 15 min.  Patient denies any vision issues any tenderness on her temples any symptoms of pain in her face, does not report tenderness in and around her face or ears no tenderness in her back or neck area no rashes, patient does grind her teeth.  No history of glaucoma or any autoimmune issues, she does occasionally get stiff neck which she does report over a few days ago but that since that has resolved.  No alcohol no tobacco although she does have secondhand exposure from her father when she was a child.  No neuropathy report or history, no miscarriages.  Patient did have COVID 2 times.   ED Course:  Vital signs in the ED were notable for the following:  Vitals:   03/09/24 1645 03/09/24 1730 03/09/24 1800 03/09/24 1850  BP: (!) 158/80 (!) 169/96 (!) 170/90 (!) 154/103  Pulse: 94 (!) 106 (!) 104 (!) 112  Temp:      Resp: (!) 23 (!) 23 (!) 24 20  Height:      Weight:      SpO2: 97% 97% 98% 95%  TempSrc:      BMI (Calculated):       >>ED evaluation thus far shows: Initial CMP shows anion gap of 16 glucose of 117 normal LFTs normal kidney function normal bicarb normal electrolytes. Lipid panel ordered shows cholesterol of 222 LDL 160 HDL 46 triglycerides 76. CBC shows white count of 10.8 hemoglobin of 14.2 platelets of 316. A1c of 5.5.   Alcohol level less than 15. Initial head CT shows no acute intracranial  abnormality, patient does have scattered mucosal thickening ethmoid and maxillary sinuses.  No dural venous sinus thrombosis, no LVO, significant stenosis, aneurysm.  >>While in the ED patient received the following: Medications  aspirin tablet 325 mg (325 mg Oral Given 03/09/24 1752)    Followed by  aspirin EC tablet 81 mg (has no administration in time range)  clopidogrel (PLAVIX) tablet 300 mg (300 mg Oral Given 03/09/24 1752)    Followed by  clopidogrel (PLAVIX) tablet 75 mg (has no administration in time range)  sodium chloride flush (NS) 0.9 % injection 3 mL (3 mLs Intravenous Given 03/09/24 1752)  iohexol (OMNIPAQUE) 350 MG/ML injection 75 mL (75 mLs Intravenous Contrast Given 03/09/24 1808)   Review of Systems  Neurological:  Positive for sensory change and weakness.   Past Medical History:  Diagnosis Date   Abnormal fasting glucose 01/25/2014   Abnormal Pap smear of cervix mid 90s   D&C   Genital herpes 09/2016   type 2 by IgG   History of Papanicolaou smear of cervix 01/23/2014   -/-   HTN (hypertension)    Hyperlipidemia    Pre-diabetes 02/2014   Vitamin D  deficiency    Past Surgical History:  Procedure Laterality Date   DILATION AND CURETTAGE OF UTERUS  MID 90S   VAGINAL DELIVERY  x2    reports that she has never smoked. She has never used smokeless tobacco. She reports current alcohol use. She reports that she does not use drugs. No Known Allergies Family History  Problem Relation Age of Onset   Uterine cancer Mother    Liver cancer Father    Retinal detachment Father    Melanoma Brother    Dementia Maternal Grandmother    Heart disease Maternal Grandfather    Stroke Paternal Grandmother    Breast cancer Neg Hx    Prior to Admission medications   Medication Sig Start Date End Date Taking? Authorizing Provider  Calcium  Carbonate (CALCIUM  500 PO) Take by mouth.    [provider]  cholecalciferol (VITAMIN D ) 1000 UNITS tablet 1,000 Units  daily.    [provider]  Multiple Vitamins-Minerals (MULTIVITAMIN WITH MINERALS) tablet Take 1 tablet by mouth daily.    [provider]                                                                                 Vitals:   03/09/24 1645 03/09/24 1730 03/09/24 1800 03/09/24 1850  BP: (!) 158/80 (!) 169/96 (!) 170/90 (!) 154/103  Pulse: 94 (!) 106 (!) 104 (!) 112  Resp: (!) 23 (!) 23 (!) 24 20  Temp:      TempSrc:      SpO2: 97% 97% 98% 95%  Weight:      Height:       Physical Exam Vitals and nursing note reviewed.  Constitutional:      General: She is not in acute distress.    Appearance: She is not ill-appearing.  HENT:     Head: Normocephalic and atraumatic.     Right Ear: Hearing and external ear normal.     Left Ear: Hearing and external ear normal.     Nose: Nose normal. No nasal deformity.     Mouth/Throat:     Lips: Pink.  Eyes:     General: Lids are normal.     Extraocular Movements: Extraocular movements intact.     Pupils: Pupils are equal, round, and reactive to light.  Neck:     Vascular: No carotid bruit.  Cardiovascular:     Rate and Rhythm: Normal rate and regular rhythm.     Pulses: Normal pulses.     Heart sounds: Normal heart sounds.  Pulmonary:     Effort: Pulmonary effort is normal.     Breath sounds: Normal breath sounds.  Abdominal:     General: Bowel sounds are normal. There is no distension.     Palpations: Abdomen is soft. There is no mass.     Tenderness: There is no abdominal tenderness.  Musculoskeletal:     Right lower leg: No edema.     Left lower leg: No edema.  Lymphadenopathy:     Cervical: No cervical adenopathy.  Skin:    General: Skin is warm.  Neurological:     General: No focal deficit present.     Mental Status: She is alert and oriented to person, place, and time.     Cranial Nerves: Cranial nerves 2-12 are intact. No cranial nerve deficit,  dysarthria or facial asymmetry.     Sensory: Sensation is  intact. No sensory deficit.     Motor: Motor function is intact. No weakness, tremor or pronator drift.     Coordination: Coordination normal. Finger-Nose-Finger Test and Heel to [facility] Test normal.     Deep Tendon Reflexes:     Reflex Scores:      Bicep reflexes are 2+ on the right side and 1+ on the left side.      Patellar reflexes are 2+ on the right side and 2+ on the left side. Psychiatric:        Attention and Perception: Attention normal.        Mood and Affect: Mood normal.        Speech: Speech normal.        Behavior: Behavior normal. Behavior is cooperative.     Labs on Admission: I have personally reviewed following labs and imaging studies.  CBC: Recent Labs  Lab 03/09/24 1611 03/09/24 1617  WBC 10.8*  --   NEUTROABS 6.0  --   HGB 14.2 13.9  HCT 43.6 41.0  MCV 89.2  --   PLT 316  --    Basic Metabolic Panel: Recent Labs  Lab 03/09/24 1611 03/09/24 1617 03/09/24 1759  NA 140 141  --   K 3.6 3.5  --   CL 101 104  --   CO2 23  --   --   GLUCOSE 117* 112*  --   BUN 12 13  --   CREATININE 0.73 0.80  --   CALCIUM  9.2  --   --   MG  --   --  2.0   GFR: Estimated Creatinine Clearance: 75.9 mL/min (by C-G formula based on SCr of 0.8 mg/dL). Liver Function Tests: Recent Labs  Lab 03/09/24 1611  AST 16  ALT 15  ALKPHOS 51  BILITOT 0.5  PROT 7.2  ALBUMIN 4.4   No results for input(s): LIPASE, AMYLASE in the last 168 hours. No results for input(s): AMMONIA in the last 168 hours. Recent Labs    05/28/23 0852 03/09/24 1611 03/09/24 1617  BUN 17 12 13   CREATININE 0.80 0.73 0.80    Cardiac Enzymes: No results for input(s): CKTOTAL, CKMB, CKMBINDEX, TROPONINI in the last 168 hours. BNP (last 3 results) No results for input(s): PROBNP in the last 8760 hours. HbA1C: Recent Labs    03/09/24 1800  HGBA1C 5.5   CBG: Recent Labs  Lab 03/09/24 1610  GLUCAP 117*   Lipid Profile: Recent Labs    03/09/24 1800  CHOL 222*  HDL 46   LDLCALC 160*  TRIG 78  CHOLHDL 4.8   Thyroid  Function Tests: No results for input(s): TSH, T4TOTAL, FREET4, T3FREE, THYROIDAB in the last 72 hours. Anemia Panel: No results for input(s): VITAMINB12, FOLATE, FERRITIN, TIBC, IRON, RETICCTPCT in the last 72 hours. Urine analysis:    Component Value Date/Time   BILIRUBINUR negative 05/28/2023 0857   KETONESUR negative 05/28/2023 0857   UROBILINOGEN 0.2 05/28/2023 0857   NITRITE Negative 05/28/2023 0857   LEUKOCYTESUR Large (3+) (A) 05/28/2023 0857   Radiological Exams on Admission: CT ANGIO HEAD NECK W WO CM Result Date: 03/09/2024 EXAM: CTA HEAD AND NECK WITH AND WITHOUT 03/09/2024 06:07:51 PM TECHNIQUE: CTA of the head and neck was performed with and without the administration of 75 mL of iohexol (OMNIPAQUE) 350 MG/ML injection. Multiplanar 2D and/or 3D reformatted images are provided for review. Automated exposure control, iterative reconstruction, and/or weight  based adjustment of the mA/kV was utilized to reduce the radiation dose to as low as reasonably achievable. Stenosis of the internal carotid arteries measured using NASCET criteria. COMPARISON: Same day CT head. CLINICAL HISTORY: Stroke/TIA, determine embolic source. C/O left sided numbness/heaviness that comes and goes that started an hour ago. Denies HA, blurred vision/ headaches. FINDINGS: CTA NECK: AORTIC ARCH AND ARCH VESSELS: Atherosclerosis at the origin of the left subclavian artery without stenosis. No dissection or arterial injury. No significant stenosis of the brachiocephalic or subclavian arteries. CERVICAL CAROTID ARTERIES: No dissection, arterial injury, or hemodynamically significant stenosis by NASCET criteria. CERVICAL VERTEBRAL ARTERIES: The left vertebral artery is dominant. The vertebral arteries are patent from the origins to the intracranial segments. Diminutive post PICA segment of the nondominant right vertebral artery. There is tortuosity  of the bilateral V1 and V2 segments. No dissection, arterial injury, or significant stenosis. LUNGS AND MEDIASTINUM: Unremarkable. SOFT TISSUES: No acute abnormality. BONES: Degenerative changes in the visualized spine. No acute abnormality. CTA HEAD: ANTERIOR CIRCULATION: The intracranial internal carotid arteries are patent bilaterally. Minimal atherosclerosis of the right cavernous ICA without stenosis. Diminutive A1 segment of the right ACA. The anterior cerebral arteries are patent bilaterally. The middle cerebral arteries are patent bilaterally. No aneurysm. POSTERIOR CIRCULATION: The posterior cerebral arteries are patent bilaterally. Fetal origin of the right PCA. The superior cerebellar arteries are patent bilaterally. The basilar artery is patent. No significant stenosis of the vertebral arteries. No aneurysm. OTHER: Mucosal thickening in the ethmoid sinuses. No dural venous sinus thrombosis on this non-dedicated study. IMPRESSION: 1. No large vessel occlusion, hemodynamically significant stenosis, or aneurysm in the head or neck. 2. Atherosclerosis at the origin of the left subclavian artery without stenosis. Electronically signed by: Donnice Mania MD 03/09/2024 06:51 PM EDT RP Workstation: [id number]   CT HEAD CODE STROKE WO CONTRAST Result Date: 03/09/2024 EXAM: CT HEAD WITHOUT CONTRAST 03/09/2024 04:22:43 PM TECHNIQUE: CT of the head was performed without the administration of intravenous contrast. Automated exposure control, iterative reconstruction, and/or weight based adjustment of the mA/kV was utilized to reduce the radiation dose to as low as reasonably achievable. COMPARISON: None available. CLINICAL HISTORY: Neuro deficit, acute, stroke suspected. Code stroke. Dr. Sallyann [phone]; Heaviness, numb in left arm. LKW 1515. FINDINGS: BRAIN AND VENTRICLES: No acute hemorrhage. No evidence of acute infarct. No hydrocephalus. No extra-axial collection. No mass effect or midline shift. ORBITS: No acute  abnormality. SINUSES: Scattered mucosal thickening in the ethmoid and maxillary sinuses. SOFT TISSUES AND SKULL: No acute soft tissue abnormality. No skull fracture. [place] stroke program early CT (aspect) score: Ganglionic (caudate, ic, lentiform nucleus, insula, M1-m3): 7 Supraganglionic (m4-m6): 3 Total: 10 IMPRESSION: 1. No acute intracranial abnormality. 2. ASPECT score is 10. 3. Findings messaged to Dr. Sallyann at 4:28PM on 03/09/24. Electronically signed by: Donnice Mania MD 03/09/2024 04:28 PM EDT RP Workstation: [id number]   Data Reviewed: Relevant notes from primary care and specialist visits, past discharge summaries as available in EHR, including Care Everywhere . Prior diagnostic testing as pertinent to current admission diagnoses, Updated medications and problem lists for reconciliation .ED course, including vitals, labs, imaging, treatment and response to treatment,Triage notes, nursing and pharmacy notes and ED provider's notes.Notable results as noted in HPI.Discussed case with EDMD/ ED APP/ or Specialty MD on call and as needed.  Assessment & Plan : >>Left arm heaviness / numbness: EKG is within normal limits, differentials include TIA CVA neuropathic pain, degenerative disc disease and radiation.  Will also obtain an  MRI of C-spine. In the emergency room patient received antiplatelet therapy loading with Plavix and aspirin and will continue aspirin daily 81 starting from tomorrow along with Plavix 75.  Will initiate statin therapy. MRI of the brain noncontrast, 2D echocardiogram. PT OT speech therapy. Appreciate neurology and stroke team following patient management.   >>Essential HTN: Patient at bedside states that she does not have any past medical history and is not on any medications. Current goal for blood pressure is permissive hypertension. PRN hydralazine for systolic blood pressure 220 and above.    DVT prophylaxis:  Heparin.  Consults:  Neurology.  Advance Care  Planning:    Code Status: Full Code   Family Communication:  Spouse.  Disposition Plan:  Home.  Severity of Illness: The appropriate patient status for this patient is OBSERVATION. Observation status is judged to be reasonable and necessary in order to provide the required intensity of service to ensure the patient's safety. The patient's presenting symptoms, physical exam findings, and initial radiographic and laboratory data in the context of their medical condition is felt to place them at decreased risk for further clinical deterioration. Furthermore, it is anticipated that the patient will be medically stable for discharge from the hospital within 2 midnights of admission.   Unresulted Labs (From admission, onward)     Start     Ordered   03/09/24 1810  D-dimer, quantitative  Once,   AD        03/09/24 1810   03/09/24 1805  TSH  Add-on,   AD        03/09/24 1838   03/09/24 1805  Vitamin B12  Add-on,   AD        03/09/24 1838   03/09/24 1801  HIV Antibody (routine testing w rflx)  (HIV Antibody (Routine testing w reflex) panel)  Once,   R        03/09/24 1838   03/09/24 1801  Urine rapid drug screen (hosp performed)not at [facility])  Once,   R        03/09/24 1838            Meds ordered this encounter  Medications   sodium chloride flush (NS) 0.9 % injection 3 mL   FOLLOWED BY Linked Order Group    aspirin tablet 325 mg    aspirin EC tablet 81 mg   FOLLOWED BY Linked Order Group    clopidogrel (PLAVIX) tablet 300 mg    clopidogrel (PLAVIX) tablet 75 mg   iohexol (OMNIPAQUE) 350 MG/ML injection 75 mL    stroke: early stages of recovery book   0.9 %  sodium chloride infusion   OR Linked Order Group    acetaminophen (TYLENOL) tablet 650 mg    acetaminophen (TYLENOL) 160 MG/5ML solution 650 mg    acetaminophen (TYLENOL) suppository 650 mg   heparin injection 5,000 Units     Orders Placed This Encounter  Procedures   CT HEAD CODE STROKE WO CONTRAST   CT ANGIO  HEAD NECK W WO CM   MR BRAIN WO CONTRAST   MR Cervical Spine Wo Contrast   Comprehensive metabolic panel   CBC   Protime-INR   APTT   Differential   Ethanol   Lipid panel   Hemoglobin A1c   Magnesium   D-dimer, quantitative   HIV Antibody (routine testing w rflx)   Urine rapid drug screen (hosp performed)not at [facility]   TSH   Vitamin B12   Diet Heart Room  service appropriate? Yes; Fluid consistency: Thin   Document Height and Actual Weight   ED Cardiac monitoring   NIH Stroke Scale   Saline Lock IV, Maintain IV access   If O2 sat <94% Administer O2 @ 2 Liters/Minute   NIHSS score documentation NIHSS score range: 0-42   Vital signs   Notify physician (specify)   OOB with assistance   Activity as tolerated   Swallow screen - If patient does NOT pass this screen, place order for SLP eval and treat (SLP2) - swallowing evaluation (BSE, MBS and/or diet order as indicated)   NIH Stroke Scale   Intake and output   Cardiac Monitoring Continuous x 24 hours Indications for use: Acute neurological event   Apply Stroke Care Plan: Ischemic Stroke, TIA   Initiate Adult Central Line Maintenance and Catheter Protocol for patients with central line (CVC, PICC, Port, Hemodialysis, Trialysis)   Discuss with patient and document patient's goals for stroke risk factor reduction   Initiate Oral Care Protocol   Initiate Carrier Fluid Protocol   Provide stroke education material to patient and family.   Nurse to provide smoking / tobacco cessation education   If the patient has passed the Stroke Swallow Screen or has a feeding tube, then RN may order General Admission PRN Orders (through manage orders) for the following patient needs: allergy symptoms (Claritin), cold sores (Carmex), cough (Robitussin DM), eye irritation (Liquifilm Tears), hemorrhoids (Tucks), indigestion (Maalox), minor skin irritation (hydrocortisone cream), muscle pain Lucienne Gay), nose irritation (saline nasal spray) and sore throat  (Chloraseptic spray).   Ambulate with assistance   Full code   [place] to Activate Code Stroke   Consult to hospitalist   Consult to Registered Dietitian   Consult to Transition of Care Team   OT eval and treat   OT eval and treat   PT eval and treat   Patient needs PT consult as soon as possible.  Discharge is pending PT consult.  Please call  PT eval and treat   ED Pulse oximetry, continuous   Oxygen therapy Mode or (Route): Nasal cannula; Liters Per Minute: 2; Keep O2 saturation between: greater than 94 %   SLP eval and treat Reason for evaluation: Cognitive/Language evaluation   CBG monitoring, ED   I-stat chem 8, ED   CBG monitoring, ED   EKG 12-Lead   ECHOCARDIOGRAM COMPLETE   Place in observation (patient's expected length of stay will be less than 2 midnights)   Fall precautions   Fall precautions    Author: Mario LULLA Blanch, MD 12 pm- 8 pm. [facility]. 03/09/2024 7:10 PM Please note for any communication after hours contact [facility] Assigned provider on call on Amion.

## 2024-03-10 ENCOUNTER — Observation Stay (HOSPITAL_COMMUNITY)

## 2024-03-10 ENCOUNTER — Other Ambulatory Visit (HOSPITAL_COMMUNITY): Payer: Self-pay

## 2024-03-10 DIAGNOSIS — E785 Hyperlipidemia, unspecified: Secondary | ICD-10-CM | POA: Diagnosis not present

## 2024-03-10 DIAGNOSIS — R531 Weakness: Secondary | ICD-10-CM | POA: Diagnosis not present

## 2024-03-10 DIAGNOSIS — G43109 Migraine with aura, not intractable, without status migrainosus: Secondary | ICD-10-CM | POA: Diagnosis not present

## 2024-03-10 DIAGNOSIS — I739 Peripheral vascular disease, unspecified: Secondary | ICD-10-CM

## 2024-03-10 DIAGNOSIS — G459 Transient cerebral ischemic attack, unspecified: Secondary | ICD-10-CM | POA: Diagnosis not present

## 2024-03-10 LAB — D-DIMER, QUANTITATIVE: D-Dimer, Quant: <0.27 ug{FEU}/mL (ref 0.00–0.50)

## 2024-03-10 LAB — HIV ANTIBODY (ROUTINE TESTING W REFLEX): HIV Screen 4th Generation wRfx: NONREACTIVE

## 2024-03-10 LAB — ECHOCARDIOGRAM COMPLETE
Area-P 1/2: 3.48 cm2
Height: 66 in
S' Lateral: 2.53 cm
Weight: 2752 [oz_av]

## 2024-03-10 MED ORDER — ASPIRIN 81 MG PO TBEC
81.0000 mg | DELAYED_RELEASE_TABLET | Freq: Every day | ORAL | 12 refills | Status: AC
  Filled 2024-03-10 – 2024-04-10 (×2): qty 30, 30d supply, fill #0
  Filled 2024-05-10: qty 30, 30d supply, fill #1
  Filled 2024-06-06: qty 30, 30d supply, fill #2

## 2024-03-10 MED ORDER — ATORVASTATIN CALCIUM 40 MG PO TABS
40.0000 mg | ORAL_TABLET | Freq: Every day | ORAL | 0 refills | Status: DC
  Filled 2024-03-10: qty 30, 30d supply, fill #0

## 2024-03-10 MED ORDER — CLOPIDOGREL BISULFATE 75 MG PO TABS
75.0000 mg | ORAL_TABLET | Freq: Every day | ORAL | 0 refills | Status: DC
  Filled 2024-03-10: qty 20, 20d supply, fill #0

## 2024-03-10 NOTE — Evaluation (Signed)
 Physical Therapy Brief Evaluation and Discharge Note Patient Details Name: Brittney Park MRN: [id number] DOB: [birth year] Today's Date: 03/10/2024   History of Present Illness  63 yo F adm 03/09/24 for LUE numbness and heaviness. MRI brain and C-spine negative. PMhx: HTN, HLD  Clinical Impression  Pt pleasant, lives with spouse, works at a desk job and noted heaviness lasting a few minutes PTA. Currently pt asymptomatic with strength and sensation WFL, VSS, and pt back to baseline functional status at independent function. Pt educated for appropriate ergonomic assessment of work space but no other therapy needs at this time with pt aware and agreeable.        PT Assessment Patient does not need any further PT services  Assistance Needed at Discharge  None    Equipment Recommendations None recommended by PT  Recommendations for Other Services       Precautions/Restrictions Precautions Precautions: None        Mobility  Bed Mobility   Supine/Sidelying to sit: Modified independent (Device/Increased time)      Transfers Overall transfer level: Independent                      Ambulation/Gait Ambulation/Gait assistance: Independent Gait Distance (Feet): 300 Feet Assistive device: None Gait Pattern/deviations: WFL(Within Functional Limits) Gait Speed: Pace WFL    Home Activity Instructions    Stairs            Modified Rankin (Stroke Patients Only)        Balance Overall balance assessment: Independent                        Pertinent Vitals/Pain PT - Brief Vital Signs All Vital Signs Stable: Yes Pain Assessment Pain Assessment: No/denies pain     Home Living Family/patient expects to be discharged to:: Private residence Living Arrangements: Spouse/significant other Available Help at Discharge: Available 24 hours/day Home Environment: Stairs to enter  [employer] of Steps: 3 Home Equipment: Agricultural consultant (2 wheels);Shower  seat;Cane - single point        Prior Function Level of Independence: Independent      UE/LE Assessment   UE ROM/Strength/Tone/Coordination: WFL    LE ROM/Strength/Tone/Coordination: [facility]      Communication   Communication Communication: No apparent difficulties     Cognition Overall Cognitive Status: Appears within functional limits for tasks assessed/performed       General Comments      Exercises     Assessment/Plan    PT Problem List         PT Visit Diagnosis Other abnormalities of gait and mobility (R26.89)    No Skilled PT Patient at baseline level of functioning;Patient is independent with all acitivity/mobility;All education completed   Co-evaluation                AMPAC 6 Clicks Help needed turning from your back to your side while in a flat bed without using bedrails?: None Help needed moving from lying on your back to sitting on the side of a flat bed without using bedrails?: None Help needed moving to and from a bed to a chair (including a wheelchair)?: None Help needed standing up from a chair using your arms (e.g., wheelchair or bedside chair)?: None Help needed to walk in hospital room?: None Help needed climbing 3-5 steps with a railing? : None 6 Click Score: 24      End of Session   Activity Tolerance: Patient  tolerated treatment well Patient left: in bed;with call bell/phone within reach;with family/visitor present Nurse Communication: Mobility status PT Visit Diagnosis: Other abnormalities of gait and mobility (R26.89)     Time: 9055-9040 PT Time Calculation (min) (ACUTE ONLY): 15 min  Charges:   PT Evaluation $PT Eval Low Complexity: 1 Low      [name] P, PT Acute Rehabilitation Services Office: [phone]   Lenoard NOVAK [name]  03/10/2024, 10:12 AM

## 2024-03-10 NOTE — Progress Notes (Signed)
 Consult received for [facility] vs SNF. Per PT, pt ambulating independently and has no f/u PT needs. ICM signing off.  Julien Das, MSW, LCSW [phone] (coverage)

## 2024-03-10 NOTE — Discharge Summary (Signed)
 Physician Discharge Summary   Patient: Brittney Park MRN: [id number] DOB: 1960-07-21  Admit date:     03/09/2024  Discharge date: 03/10/24  Discharge Physician: Carliss LELON Canales   PCP: Corwin Antu, FNP   Recommendations at discharge:    Pt to be discharged home.   If you experience worsening fever, chills, chest pain, shortness of breath, or other concerning symptoms, please call your PCP or go to the emergency department immediately.  Discharge Diagnoses: Principal Problem:   Left-sided weakness  Resolved Problems:   * No resolved hospital problems. *   Hospital Course:  63 y.o. female with no significant past medical history except for htn, hyperlipidemia presenting today for left facial warmth and left upper extremity heaviness and numbness. Pt was at her desk at work she got up moved around and moved her upper arm and it did resolved and recurred. Initially lasted about 15 min.  Patient denies any vision issues any tenderness on her temples any symptoms of pain in her face, does not report tenderness in and around her face or ears no tenderness in her back or neck area no rashes, patient does grind her teeth.  No history of glaucoma or any autoimmune issues, she does occasionally get stiff neck which she does report over a few days ago but that since that has resolved.  No alcohol no tobacco although she does have secondhand exposure from her father when she was a child.  No neuropathy report or history, no miscarriages.  Patient did have COVID 2 times.  Assessment and Plan:  Left arm heaviness/numbness with concern for TIA -Initial concern for CVA.  CT head negative for acute bleed.  MRI brain noting no acute infarcts but does mention chronic ischemic changes.  MRI spine negative for compression.  Symptoms have resolved.  Evaluated by PT/OT with no needs/recommendations.  Patient initiated on aspirin, Plavix, statin while inpatient.  LDL 160, A1c 5.5.  Would recommend patient  initiate aspirin 81 mg daily plus Plavix 75 mg daily for 21 days, then discontinue Plavix and continue aspirin thereafter.  Will also provide prescription to continue atorvastatin  40 mg daily.  Recommend patient follow closely with neurology (referral provided) in 1 to 2 weeks to work on risk factor reduction such as blood pressure, cholesterol.    Consultants: neurology Procedures performed: None Disposition: Home Diet recommendation:  Discharge Diet Orders (From admission, onward)     Start     Ordered   03/10/24 0000  Diet - low sodium heart healthy        03/10/24 1043           Regular diet  DISCHARGE MEDICATION: Allergies as of 03/10/2024   No Known Allergies      Medication List     TAKE these medications    acetaminophen 500 MG tablet Commonly known as: TYLENOL Take 1,000 mg by mouth every 6 (six) hours as needed for mild pain (pain score 1-3) or headache.   Ascorbic Acid 500 MG Chew Chew 2 each by mouth in the morning.   aspirin EC 81 MG tablet Take 1 tablet (81 mg total) by mouth daily. Swallow whole.   atorvastatin  40 MG tablet Commonly known as: LIPITOR Take 1 tablet (40 mg total) by mouth daily.   Cholecalciferol 25 MCG (1000 UT) Chew Chew 1 each by mouth in the morning.   clopidogrel 75 MG tablet Commonly known as: PLAVIX Take 1 tablet (75 mg total) by mouth daily.  Discharge Exam: Filed Weights   03/09/24 1608  Weight: 78 kg    GENERAL:  Alert, pleasant, no acute distress  HEENT:  EOMI CARDIOVASCULAR:  RRR, no murmurs appreciated RESPIRATORY:  Clear to auscultation, no wheezing, rales, or rhonchi GASTROINTESTINAL:  Soft, nontender, nondistended EXTREMITIES:  No LE edema bilaterally NEURO:  No new focal deficits appreciated SKIN:  No rashes noted PSYCH:  Appropriate mood and affect     Condition at discharge: improving  The results of significant diagnostics from this hospitalization (including imaging, microbiology,  ancillary and laboratory) are listed below for reference.   Imaging Studies: MR Cervical Spine Wo Contrast Result Date: 03/09/2024 EXAM: MRI CERVICAL SPINE WITHOUT CONTRAST 03/09/2024 08:02:51 PM TECHNIQUE: Multiplanar multisequence MRI of the cervical spine was performed. COMPARISON: None available. CLINICAL HISTORY: paresthesia. complaints of sudden onset of left arm heaviness in addition to left facial tingling. Patient states that the left arm heaviness resolved after a few minutes but still feels an off sensation. She had tingling of the left face as well that on exam had ; improved some but she states she still has sensory disturbance described as warmness on the left face that is persistent. FINDINGS: BONES AND ALIGNMENT: Straightening of the normal cervical lordosis. Normal vertebral body heights. Bone marrow signal is unremarkable. SPINAL CORD: Normal spinal cord size. No abnormal spinal cord signal. SOFT TISSUES: No paraspinal mass. C2-C3: Minimal disc bulge. No stenosis. C3-C4: Mild disc bulge. Mild left sided facet hypertrophy. No spinal stenosis. Mild left C4 foraminal narrowing. C4-C5: Small sling of disc protrusion and indents ventral thecal sac (series 9, image 19). No significant spinal stenosis. Foramina are patent. C5-C6: Degenerative vertebral disc space narrowing with diffuse disc osteophyte complex. No significant spinal stenosis. Foramina remain patent. C6-C7: Degenerative vertebral disc space narrowing with diffuse disc osteophyte complex. No significant spinal stenosis. Foramina remain patent. C7-T1: Minimal disc bulge. No spinal stenosis. Foramina remain patent. IMPRESSION: 1. No acute abnormality. 2. Mild for age cervical spondylosis as above without significant stenosis or overt neural impingement. Electronically signed by: Morene Hoard MD 03/09/2024 09:06 PM EDT RP Workstation: [id number]   MR BRAIN WO CONTRAST Result Date: 03/09/2024 EXAM: MRI BRAIN WITHOUT CONTRAST  03/09/2024 08:02:51 PM TECHNIQUE: Multiplanar multisequence MRI of the head/brain was performed without the administration of intravenous contrast. COMPARISON: Comparison with prior CT from earlier the same day. CLINICAL HISTORY: Stroke, follow up. complaints of sudden onset of left arm heaviness in addition to left facial tingling. Patient states that the left arm heaviness resolved after a few minutes but still feels an off sensation. She had tingling of the left face as well that on exam had ; improved some but she states she still has sensory disturbance described as warmness on the left face that is persistent. FINDINGS: BRAIN AND VENTRICLES: No acute infarct. No intracranial hemorrhage. No mass. No midline shift. No hydrocephalus. Patchy T2/FLAIR hyperintensity involving the periventricular and deep cerebral hemispheres, most characteristic of chronic small vessel ischemic disease, mild in nature. The sella is unremarkable. Normal flow voids. ORBITS: No acute abnormality. SINUSES AND MASTOIDS: Mild scattered mucosal thickening present about the upper and lower sides of the maxillary sinuses. No acute abnormality of the mastoids. BONES AND SOFT TISSUES: Normal marrow signal. No acute soft tissue abnormality. IMPRESSION: 1. No acute intracranial abnormality. 2. Mild chronic microvascular ischemic disease for age. Electronically signed by: Morene Hoard MD 03/09/2024 09:01 PM EDT RP Workstation: [id number]   CT ANGIO HEAD NECK W WO CM Result Date: 03/09/2024  EXAM: CTA HEAD AND NECK WITH AND WITHOUT 03/09/2024 06:07:51 PM TECHNIQUE: CTA of the head and neck was performed with and without the administration of 75 mL of iohexol (OMNIPAQUE) 350 MG/ML injection. Multiplanar 2D and/or 3D reformatted images are provided for review. Automated exposure control, iterative reconstruction, and/or weight based adjustment of the mA/kV was utilized to reduce the radiation dose to as low as reasonably achievable.  Stenosis of the internal carotid arteries measured using NASCET criteria. COMPARISON: Same day CT head. CLINICAL HISTORY: Stroke/TIA, determine embolic source. C/O left sided numbness/heaviness that comes and goes that started an hour ago. Denies HA, blurred vision/ headaches. FINDINGS: CTA NECK: AORTIC ARCH AND ARCH VESSELS: Atherosclerosis at the origin of the left subclavian artery without stenosis. No dissection or arterial injury. No significant stenosis of the brachiocephalic or subclavian arteries. CERVICAL CAROTID ARTERIES: No dissection, arterial injury, or hemodynamically significant stenosis by NASCET criteria. CERVICAL VERTEBRAL ARTERIES: The left vertebral artery is dominant. The vertebral arteries are patent from the origins to the intracranial segments. Diminutive post PICA segment of the nondominant right vertebral artery. There is tortuosity of the bilateral V1 and V2 segments. No dissection, arterial injury, or significant stenosis. LUNGS AND MEDIASTINUM: Unremarkable. SOFT TISSUES: No acute abnormality. BONES: Degenerative changes in the visualized spine. No acute abnormality. CTA HEAD: ANTERIOR CIRCULATION: The intracranial internal carotid arteries are patent bilaterally. Minimal atherosclerosis of the right cavernous ICA without stenosis. Diminutive A1 segment of the right ACA. The anterior cerebral arteries are patent bilaterally. The middle cerebral arteries are patent bilaterally. No aneurysm. POSTERIOR CIRCULATION: The posterior cerebral arteries are patent bilaterally. Fetal origin of the right PCA. The superior cerebellar arteries are patent bilaterally. The basilar artery is patent. No significant stenosis of the vertebral arteries. No aneurysm. OTHER: Mucosal thickening in the ethmoid sinuses. No dural venous sinus thrombosis on this non-dedicated study. IMPRESSION: 1. No large vessel occlusion, hemodynamically significant stenosis, or aneurysm in the head or neck. 2. Atherosclerosis at  the origin of the left subclavian artery without stenosis. Electronically signed by: Donnice Mania MD 03/09/2024 06:51 PM EDT RP Workstation: [id number]   CT HEAD CODE STROKE WO CONTRAST Result Date: 03/09/2024 EXAM: CT HEAD WITHOUT CONTRAST 03/09/2024 04:22:43 PM TECHNIQUE: CT of the head was performed without the administration of intravenous contrast. Automated exposure control, iterative reconstruction, and/or weight based adjustment of the mA/kV was utilized to reduce the radiation dose to as low as reasonably achievable. COMPARISON: None available. CLINICAL HISTORY: Neuro deficit, acute, stroke suspected. Code stroke. Dr. Sallyann [phone]; Heaviness, numb in left arm. LKW 1515. FINDINGS: BRAIN AND VENTRICLES: No acute hemorrhage. No evidence of acute infarct. No hydrocephalus. No extra-axial collection. No mass effect or midline shift. ORBITS: No acute abnormality. SINUSES: Scattered mucosal thickening in the ethmoid and maxillary sinuses. SOFT TISSUES AND SKULL: No acute soft tissue abnormality. No skull fracture. [place] stroke program early CT (aspect) score: Ganglionic (caudate, ic, lentiform nucleus, insula, M1-m3): 7 Supraganglionic (m4-m6): 3 Total: 10 IMPRESSION: 1. No acute intracranial abnormality. 2. ASPECT score is 10. 3. Findings messaged to Dr. Sallyann at 4:28PM on 03/09/24. Electronically signed by: Donnice Mania MD 03/09/2024 04:28 PM EDT RP Workstation: [id number]    Microbiology: Results for orders placed or performed in visit on 05/28/23  Urine Culture     Status: Abnormal   Collection Time: 05/28/23  9:01 AM   Specimen: Urine  Result Value Ref Range Status   MICRO NUMBER: [id number]  Final   SPECIMEN QUALITY: Adequate  Final  Sample Source URINE  Final   STATUS: FINAL  Final   ISOLATE 1: Streptococcus agalactiae (A)  Final    Comment: Greater than 100,000 CFU/mL of Group B Streptococcus isolated Beta-hemolytic streptococci are predictably susceptible to Penicillin and other  beta-lactams. Susceptibility testing not routinely performed. Please contact the laboratory within 3 days if  susceptibility testing is desired. Erythromycin and clindamycin are not recommended for treatment of urinary tract infections, but clindamycin may be useful for treatment of rectovaginal colonization or infection.     Labs: CBC: Recent Labs  Lab 03/09/24 1611 03/09/24 1617  WBC 10.8*  --   NEUTROABS 6.0  --   HGB 14.2 13.9  HCT 43.6 41.0  MCV 89.2  --   PLT 316  --    Basic Metabolic Panel: Recent Labs  Lab 03/09/24 1611 03/09/24 1617 03/09/24 1759  NA 140 141  --   K 3.6 3.5  --   CL 101 104  --   CO2 23  --   --   GLUCOSE 117* 112*  --   BUN 12 13  --   CREATININE 0.73 0.80  --   CALCIUM  9.2  --   --   MG  --   --  2.0   Liver Function Tests: Recent Labs  Lab 03/09/24 1611  AST 16  ALT 15  ALKPHOS 51  BILITOT 0.5  PROT 7.2  ALBUMIN 4.4   CBG: Recent Labs  Lab 03/09/24 1610  GLUCAP 117*    Discharge time spent: 26 minutes.  Length of inpatient stay: 0 days  Signed: Carliss LELON Canales, DO [facility] 03/10/2024

## 2024-03-10 NOTE — Progress Notes (Signed)
 OT Screen Note  Patient Details Name: Brittney Park MRN: [id number] DOB: 02-25-1961   Cancelled Treatment:    Reason Eval/Treat Not Completed: OT screened, no needs identified, will sign off ( spoke with PT )  Ely Molt 03/10/2024, 10:20 AM

## 2024-03-10 NOTE — Progress Notes (Signed)
 SLP Cancellation Note  Patient Details Name: MICHALINA CALBERT MRN: [id number] DOB: July 07, 1960   Cancelled treatment:       Reason Eval/Treat Not Completed: SLP screened, MRI negative and pt denies acute concerns. No needs identified, will sign off.   Damien Blumenthal, M.A., CCC-SLP Speech Language Pathology, Acute Rehabilitation Services  Secure Chat preferred [phone]  03/10/2024, 9:17 AM

## 2024-03-10 NOTE — Progress Notes (Addendum)
 STROKE TEAM PROGRESS NOTE    SIGNIFICANT HOSPITAL EVENTS 10/17 - Presented with c/o transient left sided numbness/heaviness and left facial tingling. CT and MRI without acute abnormalities.   INTERIM HISTORY/SUBJECTIVE Patient seen at bedside with husband. Reports yesterday she noticed sudden-onset leg heaviness and left facial tingling. States leg heaviness lasted for a few minutes and facial tingling was on and off for 6-8 hours. Patient denies headache at the time of symptom onset. Said she did have a mild headache last night.  States she had a headache 4-5 days ago of short duration. Gets headaches about once per month, which usually resolve after taking Tylenol. Denies hx of photophobia or migraines with aura.    OBJECTIVE:  CBC    Component Value Date/Time   WBC 10.8 (H) 03/09/2024 1611   RBC 4.89 03/09/2024 1611   HGB 13.9 03/09/2024 1617   HCT 41.0 03/09/2024 1617   PLT 316 03/09/2024 1611   MCV 89.2 03/09/2024 1611   MCH 29.0 03/09/2024 1611   MCHC 32.6 03/09/2024 1611   RDW 13.2 03/09/2024 1611   LYMPHSABS 3.6 03/09/2024 1611   MONOABS 0.9 03/09/2024 1611   EOSABS 0.2 03/09/2024 1611   BASOSABS 0.1 03/09/2024 1611    BMET    Component Value Date/Time   NA 141 03/09/2024 1617   NA 143 05/07/2021 0844   K 3.5 03/09/2024 1617   CL 104 03/09/2024 1617   CO2 23 03/09/2024 1611   GLUCOSE 112 (H) 03/09/2024 1617   BUN 13 03/09/2024 1617   BUN 14 05/07/2021 0844   CREATININE 0.80 03/09/2024 1617   CALCIUM  9.2 03/09/2024 1611   EGFR 87 05/07/2021 0844   GFRNONAA >60 03/09/2024 1611    IMAGING past 24 hours MR Cervical Spine Wo Contrast Result Date: 03/09/2024 EXAM: MRI CERVICAL SPINE WITHOUT CONTRAST 03/09/2024 08:02:51 PM TECHNIQUE: Multiplanar multisequence MRI of the cervical spine was performed. COMPARISON: None available. CLINICAL HISTORY: paresthesia. complaints of sudden onset of left arm heaviness in addition to left facial tingling. Patient states that  the left arm heaviness resolved after a few minutes but still feels an off sensation. She had tingling of the left face as well that on exam had ; improved some but she states she still has sensory disturbance described as warmness on the left face that is persistent. FINDINGS: BONES AND ALIGNMENT: Straightening of the normal cervical lordosis. Normal vertebral body heights. Bone marrow signal is unremarkable. SPINAL CORD: Normal spinal cord size. No abnormal spinal cord signal. SOFT TISSUES: No paraspinal mass. C2-C3: Minimal disc bulge. No stenosis. C3-C4: Mild disc bulge. Mild left sided facet hypertrophy. No spinal stenosis. Mild left C4 foraminal narrowing. C4-C5: Small sling of disc protrusion and indents ventral thecal sac (series 9, image 19). No significant spinal stenosis. Foramina are patent. C5-C6: Degenerative vertebral disc space narrowing with diffuse disc osteophyte complex. No significant spinal stenosis. Foramina remain patent. C6-C7: Degenerative vertebral disc space narrowing with diffuse disc osteophyte complex. No significant spinal stenosis. Foramina remain patent. C7-T1: Minimal disc bulge. No spinal stenosis. Foramina remain patent. IMPRESSION: 1. No acute abnormality. 2. Mild for age cervical spondylosis as above without significant stenosis or overt neural impingement. Electronically signed by: Morene Hoard MD 03/09/2024 09:06 PM EDT RP Workstation: [id number]   MR BRAIN WO CONTRAST Result Date: 03/09/2024 EXAM: MRI BRAIN WITHOUT CONTRAST 03/09/2024 08:02:51 PM TECHNIQUE: Multiplanar multisequence MRI of the head/brain was performed without the administration of intravenous contrast. COMPARISON: Comparison with prior CT from earlier the same day.  CLINICAL HISTORY: Stroke, follow up. complaints of sudden onset of left arm heaviness in addition to left facial tingling. Patient states that the left arm heaviness resolved after a few minutes but still feels an off sensation.  She had tingling of the left face as well that on exam had ; improved some but she states she still has sensory disturbance described as warmness on the left face that is persistent. FINDINGS: BRAIN AND VENTRICLES: No acute infarct. No intracranial hemorrhage. No mass. No midline shift. No hydrocephalus. Patchy T2/FLAIR hyperintensity involving the periventricular and deep cerebral hemispheres, most characteristic of chronic small vessel ischemic disease, mild in nature. The sella is unremarkable. Normal flow voids. ORBITS: No acute abnormality. SINUSES AND MASTOIDS: Mild scattered mucosal thickening present about the upper and lower sides of the maxillary sinuses. No acute abnormality of the mastoids. BONES AND SOFT TISSUES: Normal marrow signal. No acute soft tissue abnormality. IMPRESSION: 1. No acute intracranial abnormality. 2. Mild chronic microvascular ischemic disease for age. Electronically signed by: Morene Hoard MD 03/09/2024 09:01 PM EDT RP Workstation: [id number]   CT ANGIO HEAD NECK W WO CM Result Date: 03/09/2024 EXAM: CTA HEAD AND NECK WITH AND WITHOUT 03/09/2024 06:07:51 PM TECHNIQUE: CTA of the head and neck was performed with and without the administration of 75 mL of iohexol (OMNIPAQUE) 350 MG/ML injection. Multiplanar 2D and/or 3D reformatted images are provided for review. Automated exposure control, iterative reconstruction, and/or weight based adjustment of the mA/kV was utilized to reduce the radiation dose to as low as reasonably achievable. Stenosis of the internal carotid arteries measured using NASCET criteria. COMPARISON: Same day CT head. CLINICAL HISTORY: Stroke/TIA, determine embolic source. C/O left sided numbness/heaviness that comes and goes that started an hour ago. Denies HA, blurred vision/ headaches. FINDINGS: CTA NECK: AORTIC ARCH AND ARCH VESSELS: Atherosclerosis at the origin of the left subclavian artery without stenosis. No dissection or arterial injury. No  significant stenosis of the brachiocephalic or subclavian arteries. CERVICAL CAROTID ARTERIES: No dissection, arterial injury, or hemodynamically significant stenosis by NASCET criteria. CERVICAL VERTEBRAL ARTERIES: The left vertebral artery is dominant. The vertebral arteries are patent from the origins to the intracranial segments. Diminutive post PICA segment of the nondominant right vertebral artery. There is tortuosity of the bilateral V1 and V2 segments. No dissection, arterial injury, or significant stenosis. LUNGS AND MEDIASTINUM: Unremarkable. SOFT TISSUES: No acute abnormality. BONES: Degenerative changes in the visualized spine. No acute abnormality. CTA HEAD: ANTERIOR CIRCULATION: The intracranial internal carotid arteries are patent bilaterally. Minimal atherosclerosis of the right cavernous ICA without stenosis. Diminutive A1 segment of the right ACA. The anterior cerebral arteries are patent bilaterally. The middle cerebral arteries are patent bilaterally. No aneurysm. POSTERIOR CIRCULATION: The posterior cerebral arteries are patent bilaterally. Fetal origin of the right PCA. The superior cerebellar arteries are patent bilaterally. The basilar artery is patent. No significant stenosis of the vertebral arteries. No aneurysm. OTHER: Mucosal thickening in the ethmoid sinuses. No dural venous sinus thrombosis on this non-dedicated study. IMPRESSION: 1. No large vessel occlusion, hemodynamically significant stenosis, or aneurysm in the head or neck. 2. Atherosclerosis at the origin of the left subclavian artery without stenosis. Electronically signed by: Donnice Mania MD 03/09/2024 06:51 PM EDT RP Workstation: [id number]   CT HEAD CODE STROKE WO CONTRAST Result Date: 03/09/2024 EXAM: CT HEAD WITHOUT CONTRAST 03/09/2024 04:22:43 PM TECHNIQUE: CT of the head was performed without the administration of intravenous contrast. Automated exposure control, iterative reconstruction, and/or weight based  adjustment of the  mA/kV was utilized to reduce the radiation dose to as low as reasonably achievable. COMPARISON: None available. CLINICAL HISTORY: Neuro deficit, acute, stroke suspected. Code stroke. Dr. Sallyann [phone]; Heaviness, numb in left arm. LKW 1515. FINDINGS: BRAIN AND VENTRICLES: No acute hemorrhage. No evidence of acute infarct. No hydrocephalus. No extra-axial collection. No mass effect or midline shift. ORBITS: No acute abnormality. SINUSES: Scattered mucosal thickening in the ethmoid and maxillary sinuses. SOFT TISSUES AND SKULL: No acute soft tissue abnormality. No skull fracture. [place] stroke program early CT (aspect) score: Ganglionic (caudate, ic, lentiform nucleus, insula, M1-m3): 7 Supraganglionic (m4-m6): 3 Total: 10 IMPRESSION: 1. No acute intracranial abnormality. 2. ASPECT score is 10. 3. Findings messaged to Dr. Sallyann at 4:28PM on 03/09/24. Electronically signed by: Donnice Mania MD 03/09/2024 04:28 PM EDT RP Workstation: [id number]    Vitals:   03/10/24 0032 03/10/24 0419 03/10/24 0647 03/10/24 0900  BP: (!) 157/97  (!) 143/92 135/78  Pulse: (!) 102 75 81 95  Resp: (!) 26 15 16  (!) 21  Temp: 98.4 F (36.9 C)  98 F (36.7 C) 98 F (36.7 C)  TempSrc: Oral   Oral  SpO2: 96% 95% 97% 94%  Weight:      Height:         PHYSICAL EXAM General:  Alert, well-nourished, well-developed patient in no acute distress Psych:  Mood and affect appropriate for situation CV: Regular rate and rhythm on monitor Respiratory:  Regular, unlabored respirations on room air   NEURO:  Mental Status: AA&Ox3, patient is able to give clear and coherent history Speech/Language: speech is without dysarthria or aphasia.  Naming, repetition, fluency, and comprehension intact.  Cranial Nerves:  II: PERRL. Visual fields full.  III, IV, VI: EOMI. Eyelids elevate symmetrically.  V: Sensation is intact to light touch and symmetrical to face.  VII: Face is symmetrical resting and smiling VIII:  hearing intact to voice. IX, X: Palate elevates symmetrically. Phonation is normal.  XI: Shoulder shrug 5/5. XII: tongue is midline without fasciculations. Motor: 5/5 strength to all muscle groups tested.  Tone: is normal and bulk is normal Sensation- Intact to light touch bilaterally. Extinction absent to light touch to DSS.   Coordination: FTN intact bilaterally, HKS: no ataxia in BLE.No drift.  Gait- deferred  Most Recent NIH: 0   ASSESSMENT/PLAN:  Ms. PEPPER KERRICK is a 63 y.o. female with history of hypertension and hyperlipidemia presenting with mild transient left arm heaviness and left facial tingling.  NIH on Admission 1.  Transient nature of patient's symptoms and negative imaging on head CT/MRI are suggestive of migraine equivalent vs TIA. Suspect likely migraine equivalent given patient reports past history of recurrent headaches and if this were TIA, would expect greater symptom severity and burden beyond only limb heaviness and facial tingling. However, cannot definitively rule out TIA. For this reason, patient is recommended to start DAPT for 3 weeks, then aspirin alone. Patient stable from neuro standpoint to discharge.  Migraine equivalent vs TIA Code Stroke CT head: No acute abnormality. ASPECTS 10.    CTA head & neck: No large vessel occlusion, hemodynamically significant stenosis, or aneurysm in the head or neck. Atherosclerosis at the origin of the left subclavian artery without stenosis.  MRI: No acute intracranial abnormality. Mild chronic microvascular ischemic disease for age.  2D Echo EF 60 to 65%, no PFO LDL 160 HgbA1c 5.5 VTE prophylaxis - Heparin SQ No antithrombotic prior to admission, now on aspirin 81 mg daily and clopidogrel 75 mg  daily for 3 weeks and then aspirin alone. Therapy recommendations:  No follow up needed  Disposition:  Home  Migraine/headache Patient denies history of migraine, however reported headache once a month or once or 2 months,  Tylenol helped, does not need to go to dark room, not associate with nausea vomiting This time patient had headache 4 to 5 days ago before this event and had mild headache after the event. Follow-up with PCP for better headache prevention.  Hypertension Home meds:  None Stable BP goal: normotension  Hyperlipidemia Home meds: None LDL 160, goal < 70 Continue atorvastatin  40 mg Continue statin at discharge  Other Stroke Risk Factors Family hx stroke (paternal grandfather)  Hospital day # 0  Ashley Gravely, MD, PGY-1  ATTENDING NOTE: I reviewed above note and agree with the assessment and plan. Pt was seen and examined.   Husband at bedside.  Patient sitting in chair, neurologically intact.  She said her symptoms are resolved.  Has had a history of headache every month or every 2 months, Tylenol effective, no associated with symptoms in the past.  This time she did have headache before and after episode.  Etiology of patient's symptoms could be complicated migraine equivalent, cannot rule out TIA.  Will do DAPT for 3 weeks and then aspirin alone.  LDL was high, on Lipitor 40.  PT and OT no recommendation.  Follow-up with [facility].  For detailed assessment and plan, please refer to above as I have made changes wherever appropriate.   Ary Cummins, MD PhD Stroke Neurology 03/10/2024 7:23 PM    To contact Stroke Continuity provider, please refer to [email or web address]. After hours, contact General Neurology

## 2024-03-13 ENCOUNTER — Telehealth: Payer: Self-pay

## 2024-03-13 NOTE — Transitions of Care (Post Inpatient/ED Visit) (Signed)
   03/13/2024  Name: Brittney Park MRN: [id number] DOB: 21-May-1961  Today's TOC FU Call Status: Today's TOC FU Call Status:: Successful TOC FU Call Completed TOC FU Call Complete Date: 03/13/24 Patient's Name and Date of Birth confirmed.  Transition Care Management Follow-up Telephone Call Date of Discharge: 03/10/24 Discharge Facility: [facility]) Type of Discharge: Inpatient Admission (Observation) Primary Inpatient Discharge Diagnosis:: TIA How have you been since you were released from the hospital?: Better Any questions or concerns?: Yes Patient Questions/Concerns:: (S) Would like to know if she needs to see cardiology after having echocardiogram done; results not reviewed during stay Patient Questions/Concerns Addressed: Notified Provider of Patient Questions/Concerns  Items Reviewed: Did you receive and understand the discharge instructions provided?: Yes Medications obtained,verified, and reconciled?: Yes (Medications Reviewed) Any new allergies since your discharge?: No Dietary orders reviewed?: NA Do you have support at home?: Yes  Medications Reviewed Today: Medications Reviewed Today     Reviewed by Lavelle Charmaine NOVAK, LPN (Licensed Practical Nurse) on 03/13/24 at 1533  Med List Status: <None>   Medication Order Taking? Sig Documenting Provider Last Dose Status Informant  acetaminophen (TYLENOL) 500 MG tablet [id number] Yes Take 1,000 mg by mouth every 6 (six) hours as needed for mild pain (pain score 1-3) or headache. [provider]  Active Self, Spouse/Significant Other, Pharmacy Records  Ascorbic Acid 500 MG CHEW [id number] Yes Chew 2 each by mouth in the morning. [provider]  Active Self, Spouse/Significant Other, Pharmacy Records  aspirin EC 81 MG tablet [id number] Yes Take 1 tablet (81 mg total) by mouth daily. Swallow whole. Arlon Carliss ORN, DO  Active   atorvastatin  (LIPITOR) 40 MG tablet [id number] Yes Take 1 tablet (40 mg total) by mouth  daily. Arlon Carliss ORN, DO  Active   Cholecalciferol 25 MCG (1000 UT) CHEW [id number] Yes Chew 1 each by mouth in the morning. [provider]  Active Self, Spouse/Significant Other, Pharmacy Records  clopidogrel (PLAVIX) 75 MG tablet [id number] Yes Take 1 tablet (75 mg total) by mouth daily. Arlon Carliss ORN, DO  Active             Home Care and Equipment/Supplies: Were Home Health Services Ordered?: NA Any new equipment or medical supplies ordered?: NA  Functional Questionnaire: Do you need assistance with bathing/showering or dressing?: No Do you need assistance with meal preparation?: No Do you need assistance with eating?: No Do you have difficulty maintaining continence: No Do you need assistance with getting out of bed/getting out of a chair/moving?: No Do you have difficulty managing or taking your medications?: No  Follow up appointments reviewed: PCP Follow-up appointment confirmed?: Yes Date of PCP follow-up appointment?: 03/15/24 Follow-up Provider: Ginger Patrick FNP Specialist Hospital Follow-up appointment confirmed?: No Reason Specialist Follow-Up Not Confirmed: Patient has Specialist Provider Number and will Call for Appointment Do you need transportation to your follow-up appointment?: No Do you understand care options if your condition(s) worsen?: Yes-patient verbalized understanding    SIGNATURE Charmaine Lavelle, LPN [place] [facility] Advisor [place] [facility] You Are. We Are. [facility] Direct Dial [phone]

## 2024-03-14 NOTE — Telephone Encounter (Signed)
 NOTED

## 2024-03-15 ENCOUNTER — Ambulatory Visit (INDEPENDENT_AMBULATORY_CARE_PROVIDER_SITE_OTHER): Admitting: Family

## 2024-03-15 ENCOUNTER — Encounter: Payer: Self-pay | Admitting: Family

## 2024-03-15 ENCOUNTER — Ambulatory Visit: Attending: Family

## 2024-03-15 VITALS — BP 146/92 | HR 89 | Temp 99.2°F | Ht 66.0 in | Wt 174.6 lb

## 2024-03-15 DIAGNOSIS — E781 Pure hyperglyceridemia: Secondary | ICD-10-CM | POA: Diagnosis not present

## 2024-03-15 DIAGNOSIS — R531 Weakness: Secondary | ICD-10-CM

## 2024-03-15 DIAGNOSIS — G459 Transient cerebral ischemic attack, unspecified: Secondary | ICD-10-CM

## 2024-03-15 DIAGNOSIS — Q250 Patent ductus arteriosus: Secondary | ICD-10-CM | POA: Insufficient documentation

## 2024-03-15 DIAGNOSIS — R002 Palpitations: Secondary | ICD-10-CM

## 2024-03-15 DIAGNOSIS — J0110 Acute frontal sinusitis, unspecified: Secondary | ICD-10-CM

## 2024-03-15 DIAGNOSIS — R931 Abnormal findings on diagnostic imaging of heart and coronary circulation: Secondary | ICD-10-CM | POA: Insufficient documentation

## 2024-03-15 MED ORDER — AMOXICILLIN-POT CLAVULANATE 875-125 MG PO TABS: 1.0000 | ORAL_TABLET | Freq: Two times a day (BID) | ORAL | 0 refills | Status: DC

## 2024-03-15 NOTE — Progress Notes (Signed)
 Established Patient Office Visit  Subjective:      CC:  Chief Complaint  Patient presents with   Follow-up    ER follow up    HPI: Brittney Park is a 63 y.o. female presenting on 03/15/2024 for Follow-up (ER follow up) .  Discussed the use of AI scribe software for clinical note transcription with the patient, who gave verbal consent to proceed.  History of Present Illness Brittney Park is a 63 year old female with a history of elevated cholesterol who presents for follow-up after a suspected transient ischemic attack (TIA).  On October 16th, she experienced left facial sensation changes and left upper extremity weakness and sensation, which began around 3:00 PM. Her left arm felt 'heavy' but not numb, and her face had a 'tingly' sensation. She went to the hospital at 4:02 PM, where the extremity changes resolved spontaneously prior to arrival. She was observed for about 20 hours and discharged on October 17th.  During her hospital stay, she underwent a CT of the head, which showed no acute intracranial abnormalities, and an EKG, which showed normal sinus rhythm. Her initial blood pressure was elevated at 171/101, and she had sinus tachycardia. She was given aspirin and Plavix, with Plavix to be continued for 21 days. She was also started on atorvastatin  40 mg, and she continues to take vitamin C, vitamin D , and a multivitamin. She reports no slurred speech or difficulty swallowing. Her blood pressure readings at home have varied, with some readings in the 130s/80s and others higher.  She has a history of elevated cholesterol, with her LDL increasing from 131 to 160. She is currently on atorvastatin  (Lipitor) 40 mg. She has no history of smoking and is not overweight.  She reports a cough with congestion for the past week, which has not improved. The cough is deeper than normal, and she has experienced some sinus pressure. She has been taking Delsym and guaifenesin  for relief. No fever,  wheezing, or significant shortness of breath.  No slurred speech, difficulty swallowing, chest pain, or shortness of breath. Reports occasional palpitations and awareness of heartbeats since the TIA event.         Social history:  Relevant past medical, surgical, family and social history reviewed and updated as indicated. Interim medical history since our last visit reviewed.  Allergies and medications reviewed and updated.  DATA REVIEWED: CHART IN EPIC     ROS: Negative unless specifically indicated above in HPI.    Current Outpatient Medications:    amoxicillin -clavulanate (AUGMENTIN ) 875-125 MG tablet, Take 1 tablet by mouth 2 (two) times daily., Disp: 20 tablet, Rfl: 0   Ascorbic Acid 500 MG CHEW, Chew 2 each by mouth in the morning., Disp: , Rfl:    aspirin EC 81 MG tablet, Take 1 tablet (81 mg total) by mouth daily. Swallow whole., Disp: 30 tablet, Rfl: 12   atorvastatin  (LIPITOR) 40 MG tablet, Take 1 tablet (40 mg total) by mouth daily., Disp: 30 tablet, Rfl: 0   Cholecalciferol 25 MCG (1000 UT) CHEW, Chew 1 each by mouth in the morning., Disp: , Rfl:    clopidogrel (PLAVIX) 75 MG tablet, Take 1 tablet (75 mg total) by mouth daily., Disp: 20 tablet, Rfl: 0        Objective:        BP (!) 146/92 (BP Location: Left Arm, Patient Position: Sitting, Cuff Size: Large)   Pulse 89   Temp 99.2 F (37.3 C) (Temporal)  Ht 5' 6 (1.676 m)   Wt 174 lb 9.6 oz (79.2 kg)   LMP 12/29/2012   SpO2 97%   BMI 28.18 kg/m   Physical Exam VITALS: BP- 146/92 HEENT: No sinus tenderness.  Wt Readings from Last 3 Encounters:  03/15/24 174 lb 9.6 oz (79.2 kg)  03/09/24 172 lb (78 kg)  05/28/23 172 lb 3.2 oz (78.1 kg)    Physical Exam Constitutional:      General: She is not in acute distress.    Appearance: Normal appearance. She is normal weight. She is not ill-appearing, toxic-appearing or diaphoretic.  HENT:     Head: Normocephalic.     Right Ear: Tympanic membrane  normal.     Left Ear: Tympanic membrane normal.     Nose: Nose normal.     Mouth/Throat:     Mouth: Mucous membranes are dry.     Pharynx: No oropharyngeal exudate or posterior oropharyngeal erythema.  Eyes:     Extraocular Movements: Extraocular movements intact.     Pupils: Pupils are equal, round, and reactive to light.  Cardiovascular:     Rate and Rhythm: Normal rate and regular rhythm.     Pulses: Normal pulses.     Heart sounds: Normal heart sounds.  Pulmonary:     Effort: Pulmonary effort is normal.     Breath sounds: Normal breath sounds.  Musculoskeletal:     Cervical back: Normal range of motion.  Neurological:     General: No focal deficit present.     Mental Status: She is alert and oriented to person, place, and time. Mental status is at baseline.  Psychiatric:        Mood and Affect: Mood normal.        Behavior: Behavior normal.        Thought Content: Thought content normal.        Judgment: Judgment normal.          Results LABS WBC: 10.8 (03/09/2024)  RADIOLOGY Head CT: No acute intracranial abnormality (03/09/2024)  DIAGNOSTIC EKG: Normal sinus rhythm, no ST-elevation myocardial infarction (STEMI) (03/09/2024) Echocardiogram: Ejection fraction 60-65%, normal right ventricular systolic function, inadequate tricuspid regurgitation, grossly normal mitral valve, very minimal aortic valve regurgitation, aortic valve sclerosis, possible patent foramen ovale, negative bubble study (03/10/2024)  Assessment & Plan:   Assessment and Plan Assessment & Plan Possible transient ischemic attack (TIA) follow-up Recent episode of left facial sensation changes and left upper extremity weakness, suspected TIA. Symptoms resolved spontaneously. CT head showed no acute intracranial abnormality. EKG showed normal sinus rhythm. Discharged on dual antiplatelet therapy with aspirin and Plavix. Referral to neurology pending. Insurance concerns due to upcoming retirement. -  Continue aspirin therapy - Complete 21-day course of Plavix - Follow up with neurology; if no contact in two weeks, notify provider  Hypertension Blood pressure readings at home range from 128/87 to 135/86. In-office reading was 146/92. Target blood pressure is less than 130/90 due to possible TIA history. - Monitor blood pressure daily at home, ensuring proper technique - Report if blood pressure consistently exceeds 130/90  Hyperlipidemia Recent increase in LDL cholesterol from 131 to 160. Atorvastatin  (Lipitor) 40 mg started. Goal LDL is less than 70 due to possible TIA history. Discussed potential side effects of statins and alternative options if joint pain occurs. - Continue atorvastatin  40 mg daily - Repeat lipid panel in 6 weeks   Palpitations Intermittent awareness of heartbeats and possible palpitations since recent events. No associated chest pain or  shortness of breath. Echocardiogram showed normal ejection fraction and mild aortic regurgitation. - Order 14-day heart monitor for home enrollment to assess heart rhythm  Aortic valve sclerosis with mild aortic regurgitation Echocardiogram showed mild aortic valve sclerosis and regurgitation. No stenosis present. - Lifestyle modifications recommended to manage cholesterol and blood pressure  Possible patent foramen ovale (PFO) Echocardiogram suggested possible PFO with left to right shunt. Typically asymptomatic and incidental finding. Discussed potential risks and commonality of PFO. - Refer to cardiology for evaluation of possible PFO/palpitations  Acute cough with possible acute sinusitis or bronchitis Persistent cough for one week with congestion and sinus pressure. No fever. Possible sinusitis or bronchitis considered. - Prescribe Augmentin  for possible sinusitis or bronchitis - Advise against using decongestants that may raise blood pressure - Recommend guaifenesin  for symptomatic relief -Take antibiotic as prescribed.  Increase oral fluids. Pt to f/u if sx worsen and or fail to improve in 2-3 days.         Return in about 6 weeks (around 04/26/2024) for f/u blood pressure, f/u cholesterol.     Ginger Patrick, MSN, APRN, FNP-C [facility]

## 2024-03-27 ENCOUNTER — Ambulatory Visit: Admitting: Cardiology

## 2024-03-28 ENCOUNTER — Encounter: Payer: Self-pay | Admitting: Neurology

## 2024-04-10 ENCOUNTER — Other Ambulatory Visit: Payer: Self-pay

## 2024-04-13 ENCOUNTER — Other Ambulatory Visit (HOSPITAL_BASED_OUTPATIENT_CLINIC_OR_DEPARTMENT_OTHER)

## 2024-04-13 DIAGNOSIS — R002 Palpitations: Secondary | ICD-10-CM | POA: Diagnosis not present

## 2024-04-16 DIAGNOSIS — R002 Palpitations: Secondary | ICD-10-CM

## 2024-04-17 ENCOUNTER — Ambulatory Visit: Attending: Cardiovascular Disease | Admitting: Cardiovascular Disease

## 2024-04-17 ENCOUNTER — Encounter: Payer: Self-pay | Admitting: Cardiovascular Disease

## 2024-04-17 VITALS — BP 128/90 | HR 86 | Ht 66.0 in | Wt 178.4 lb

## 2024-04-17 DIAGNOSIS — I471 Supraventricular tachycardia, unspecified: Secondary | ICD-10-CM | POA: Diagnosis not present

## 2024-04-17 DIAGNOSIS — R002 Palpitations: Secondary | ICD-10-CM

## 2024-04-17 NOTE — Progress Notes (Signed)
 Chief Complaint  Patient presents with   Follow-up    SVT   History of Present Illness: 63 yo female with history of SVT and HLD who is here today as a new consult, referred by Ginger Patrick, NP, for the evaluation of palpitations. She had symptoms c/w possible TIA in October 2025. She says her arm felt heavy. Echo 03/10/24 with LVEF=60-65%. Normal RV function. Mild AI. Cardiac monitor with 17 short runs of SVT (longest 9 seconds), but no evidence of atrial fibrillation.   She tells me today that she feels well. No chest pain, dyspnea. She has rare palpitations.   Primary Care Physician: Patrick Ginger, FNP   Past Medical History:  Diagnosis Date   Abnormal fasting glucose 01/25/2014   Abnormal Pap smear of cervix mid 90s   D&C   Genital herpes 09/2016   type 2 by IgG   History of Papanicolaou smear of cervix 01/23/2014   -/-   HTN (hypertension)    Hyperlipidemia    Pre-diabetes 02/2014   Vitamin D  deficiency     Past Surgical History:  Procedure Laterality Date   DILATION AND CURETTAGE OF UTERUS  MID 90S   VAGINAL DELIVERY     x2    Current Outpatient Medications  Medication Sig Dispense Refill   Ascorbic Acid 500 MG CHEW Chew 2 each by mouth in the morning.     aspirin  EC 81 MG tablet Take 1 tablet (81 mg total) by mouth daily. Swallow whole. 30 tablet 12   Cholecalciferol 25 MCG (1000 UT) CHEW Chew 1 each by mouth in the morning.     No current facility-administered medications for this visit.    No Known Allergies  Social History   Socioeconomic History   Marital status: Married    Spouse name: Not on file   Number of children: 2   Years of education: Not on file   Highest education level: Some college, no degree  Occupational History   Occupation: Chief Of Staff - Chemical Engineer: [employer]  Tobacco Use   Smoking status: Never   Smokeless tobacco: Never  Vaping Use   Vaping status: Never Used  Substance and Sexual  Activity   Alcohol use: Yes    Comment: Occasional   Drug use: No   Sexual activity: Yes    Partners: Male    Birth control/protection: Post-menopausal  Other Topics Concern   Not on file  Social History Narrative   Lives in [place] with husband. One cat.  Work - Designer, Jewellery Exercise -  NODaily Caffeine Use:  1 cup coffee AM   Social Drivers of Corporate Investment Banker Strain: Low Risk  (05/27/2023)   Overall Financial Resource Strain (CARDIA)    Difficulty of Paying Living Expenses: Not very hard  Food Insecurity: No Food Insecurity (05/27/2023)   Hunger Vital Sign    Worried About Running Out of Food in the Last Year: Never true    Ran Out of Food in the Last Year: Never true  Transportation Needs: No Transportation Needs (05/27/2023)   PRAPARE - Administrator, Civil Service (Medical): No    Lack of Transportation (Non-Medical): No  Physical Activity: Unknown (05/27/2023)   Exercise Vital Sign    Days of Exercise per Week: 0 days    Minutes of Exercise per Session: Not on file  Stress: No Stress Concern Present (05/27/2023)   [employer] of Occupational Health -  Occupational Stress Questionnaire    Feeling of Stress : Only a little  Social Connections: Socially Isolated (05/27/2023)   Social Connection and Isolation Panel    Frequency of Communication with Friends and Family: Twice a week    Frequency of Social Gatherings with Friends and Family: Never    Attends Religious Services: Never    Diplomatic Services Operational Officer: No    Attends Engineer, Structural: Not on file    Marital Status: Married  Catering Manager Violence: Not on file    Family History  Problem Relation Age of Onset   Uterine cancer Mother    Liver cancer Father    Retinal detachment Father    Melanoma Brother    Dementia Maternal Grandmother    Heart disease Maternal Grandfather    Stroke Paternal Grandmother    Breast cancer Neg Hx      Review of Systems:  As stated in the HPI and otherwise negative.   BP (!) 128/90   Pulse 86   Ht 5' 6 (1.676 m)   Wt 178 lb 6.4 oz (80.9 kg)   LMP 12/29/2012   SpO2 96%   BMI 28.79 kg/m   Physical Examination: General: Well developed, well nourished, NAD  HEENT: OP clear, mucus membranes moist  SKIN: warm, dry. No rashes. Neuro: No focal deficits  Musculoskeletal: Muscle strength 5/5 all ext  Psychiatric: Mood and affect normal  Neck: No JVD, no carotid bruits, no thyromegaly, no lymphadenopathy.  Lungs:Clear bilaterally, no wheezes, rhonci, crackles Cardiovascular: Regular rate and rhythm. No murmurs, gallops or rubs. Abdomen:Soft. Bowel sounds present. Non-tender.  Extremities: No lower extremity edema. Pulses are 2 + in the bilateral DP/PT.  EKG:  EKG is ordered today. The ekg ordered today demonstrates  EKG Interpretation Date/Time:  Monday April 17 2024 08:38:16 EST Ventricular Rate:  86 PR Interval:  146 QRS Duration:  72 QT Interval:  392 QTC Calculation: 469 R Axis:   16  Text Interpretation: Normal sinus rhythm Normal ECG Confirmed by Verlin Bruckner [phone]) on 04/17/2024 8:48:07 AM    Recent Labs: 03/09/2024: ALT 15; BUN 13; Creatinine, Ser 0.80; Hemoglobin 13.9; Magnesium 2.0; Platelets 316; Potassium 3.5; Sodium 141; TSH 3.973   Lipid Panel    Component Value Date/Time   CHOL 222 (H) 03/09/2024 1800   CHOL 240 (H) 05/07/2021 0844   TRIG 78 03/09/2024 1800   HDL 46 03/09/2024 1800   HDL 47 05/07/2021 0844   CHOLHDL 4.8 03/09/2024 1800   VLDL 16 03/09/2024 1800   LDLCALC 160 (H) 03/09/2024 1800   LDLCALC 175 (H) 05/07/2021 0844   LDLDIRECT 153.0 01/05/2013 0915     Wt Readings from Last 3 Encounters:  04/17/24 178 lb 6.4 oz (80.9 kg)  03/15/24 174 lb 9.6 oz (79.2 kg)  03/09/24 172 lb (78 kg)    Assessment and Plan:   1. Palpitations/SVT: Rare palpitations. Will not start any medication at this time. We reviewed vagal maneuvers  today.   Labs/ tests ordered today include:   Orders Placed This Encounter  Procedures   EKG 12-Lead   Disposition:   F/U with me in 12 months  Signed, Bruckner Verlin, MD, [facility] 04/17/2024 10:00 AM    [facility] [address] [place], [place], [place]  [address] Phone: [phone]; Fax: [phone]

## 2024-04-17 NOTE — Patient Instructions (Signed)

## 2024-04-24 ENCOUNTER — Ambulatory Visit: Payer: Self-pay | Admitting: Family

## 2024-04-25 NOTE — Progress Notes (Signed)
 Initial neurology clinic note  Brittney Park MRN: [id number] DOB: June 14, 1960  Referring provider: Corwin Antu, FNP  Primary care provider: Corwin Antu, FNP  Reason for consult: transient left arm heaviness  Subjective:  This is [name], a 64 y.o. left-handed female with a medical history of HTN, HLD, pre-DM who presents to neurology clinic with transient left arm heaviness. The patient is alone today.  Patient was at work on 03/09/24. She was working with her left arm a lot (scanning and putting papers up). She then noticed acute onset left arm heaviness (whole arm). She may have had a little tingling in the left cheek as well. The symptoms in her LUE lasted 15-20 minutes. She went to the ED. BP was elevated to 171/101. CTH, CTA head and neck, and MRI brain were all unremarkable with no evidence of stroke. She was told this could be TIA. She given DAPT (asa and plavix ) for 21 days followed by asa monotherapy. She is now taking aspirin  81 mg daily alone. She was also started on atorvastatin  40 mg daily.  She will occasionally have tingling in her fingertips, particularly when waking up. She mentions she was told she had arthritis in her wrists and has been given wrist brace to wear in past. She has had occasional tingling in the left face, but nothing significant. This has not happened recently.  She has occasional neck tightness but no pain or radiation down into arms.  She is a never smoker. EtOH: 2-3 times per year Restrictive diet: no Family history of neurologic disease: maternal aunt has MS, another maternal aunt has tremors and maybe PD  MEDICATIONS:  Outpatient Encounter Medications as of 05/04/2024  Medication Sig   Ascorbic Acid 500 MG CHEW Chew 2 each by mouth in the morning.   aspirin  EC 81 MG tablet Take 1 tablet (81 mg total) by mouth daily. Swallow whole.   atorvastatin  (LIPITOR) 40 MG tablet Take 1 tablet (40 mg total) by mouth daily.   Blood Pressure  Monitoring (BLOOD PRESSURE CUFF) MISC 1 each by Does not apply route daily.   Cholecalciferol 25 MCG (1000 UT) CHEW Chew 1 each by mouth in the morning.   losartan  (COZAAR ) 50 MG tablet Take 1 tablet (50 mg total) by mouth daily.   No facility-administered encounter medications on file as of 05/04/2024.    PAST MEDICAL HISTORY: Past Medical History:  Diagnosis Date   Abnormal fasting glucose 01/25/2014   Abnormal Pap smear of cervix mid 90s   D&C   Genital herpes 09/2016   type 2 by IgG   History of Papanicolaou smear of cervix 01/23/2014   -/-   HTN (hypertension)    Hyperlipidemia    Pre-diabetes 02/2014   Vitamin D  deficiency     PAST SURGICAL HISTORY: Past Surgical History:  Procedure Laterality Date   DILATION AND CURETTAGE OF UTERUS  MID 90S   VAGINAL DELIVERY     x2    ALLERGIES: No Known Allergies  FAMILY HISTORY: Family History  Problem Relation Age of Onset   Dementia Mother    Uterine cancer Mother    Liver cancer Father    Retinal detachment Father    Melanoma Brother    Multiple sclerosis Maternal Aunt    Dementia Maternal Grandmother    Heart disease Maternal Grandfather    Stroke Paternal Grandmother    Breast cancer Neg Hx     SOCIAL HISTORY: Social History   Tobacco Use  Smoking status: Never   Smokeless tobacco: Never  Vaping Use   Vaping status: Never Used  Substance Use Topics   Alcohol use: Yes    Comment: Occasional   Drug use: No   Social History   Social History Narrative   Lives in [place] with husband. One cat.  Work - Designer, [employer] Exercise -  [employer] Caffeine Use:  1 cup coffee AM      Left handed    Objective:  Vital Signs:  BP (!) 157/99   Pulse 76   Ht 5' 6 (1.676 m)   Wt 177 lb 6.4 oz (80.5 kg)   LMP 12/29/2012   SpO2 97%   BMI 28.63 kg/m   General: General appearance: Awake and alert. No distress. Cooperative with exam.  Skin: No obvious rash or jaundice. HEENT: Atraumatic.  Anicteric. Lungs: Non-labored breathing on room air  Heart: Regular Extremities: No edema. No obvious deformity.  Musculoskeletal: No obvious joint swelling. Psych: Affect appropriate.  Neurological: Mental Status: Alert. Speech fluent. No pseudobulbar affect Cranial Nerves: CNII: No RAPD. Visual fields intact. CNIII, IV, VI: PERRL. No nystagmus. EOMI. CN V: Facial sensation intact bilaterally to fine touch. CN VII: Facial muscles symmetric and strong. No ptosis at rest. CN VIII: Hears finger rub well bilaterally. CN IX: No hypophonia. CN X: Palate elevates symmetrically. CN XI: Full strength shoulder shrug bilaterally. CN XII: Tongue protrusion full and midline. No atrophy or fasciculations. No significant dysarthria Motor: Tone is normal.  Individual muscle group testing (MRC grade out of 5):  Movement     Neck flexion 5    Neck extension 5     Right Left   Shoulder abduction 5 5   Elbow flexion 5 5   Elbow extension 5 5   Finger abduction - FDI 5 5   Finger abduction - ADM 5 5   Finger extension 5 5   Finger distal flexion - 2/3 5 5    Finger distal flexion - 4/5 5 5    Thumb flexion - FPL 5 5   Thumb abduction - APB 5- 5-    Hip flexion 5 5   Hip extension 5 5   Hip adduction 5 5   Hip abduction 5 5   Knee extension 5 5   Knee flexion 5 5   Dorsiflexion 5 5   Plantarflexion 5 5    Reflexes:  Right Left   Bicep 2+ 2+   Tricep 2+ 2+   BrRad 2+ 2+   Knee 2+ 2+   Ankle 2+ 2+    Sensation: Pinprick intact in all extremities Coordination: Intact finger-to- nose-finger bilaterally. Romberg negative. Gait: Able to rise from chair with arms crossed unassisted. Normal, narrow-based gait.  Labs and Imaging review: Internal labs: 03/09/24: HbA1c: 5.5 Lipid panel: tChol 222, LDL 160, TG 78 Chem 8 significant for glucose 112 B12: 468 TSH wnl  Imaging/Procedures: CT head wo contrast (03/09/24): IMPRESSION: 1. No acute intracranial abnormality. 2. ASPECT  score is 10.  CTA head and neck (03/09/24): IMPRESSION: 1. No large vessel occlusion, hemodynamically significant stenosis, or aneurysm in the head or neck. 2. Atherosclerosis at the origin of the left subclavian artery without stenosis.  MRI brain wo contrast (03/09/24): BRAIN AND VENTRICLES: No acute infarct. No intracranial hemorrhage. No mass. No midline shift. No hydrocephalus. Patchy T2/FLAIR hyperintensity involving the periventricular and deep cerebral hemispheres, most characteristic of chronic small vessel ischemic disease, mild in nature. The sella is unremarkable. Normal flow voids.  ORBITS: No acute abnormality.   SINUSES AND MASTOIDS: Mild scattered mucosal thickening present about the upper and lower sides of the maxillary sinuses. No acute abnormality of the mastoids.   BONES AND SOFT TISSUES: Normal marrow signal. No acute soft tissue abnormality.   IMPRESSION: 1. No acute intracranial abnormality. 2. Mild chronic microvascular ischemic disease for age.  MRI cervical spine wo contrast (03/09/24): BONES AND ALIGNMENT: Straightening of the normal cervical lordosis. Normal vertebral body heights. Bone marrow signal is unremarkable.   SPINAL CORD: Normal spinal cord size. No abnormal spinal cord signal.   SOFT TISSUES: No paraspinal mass.   C2-C3: Minimal disc bulge. No stenosis.   C3-C4: Mild disc bulge. Mild left sided facet hypertrophy. No spinal stenosis. Mild left C4 foraminal narrowing.   C4-C5: Small sling of disc protrusion and indents ventral thecal sac (series 9, image 19). No significant spinal stenosis. Foramina are patent.   C5-C6: Degenerative vertebral disc space narrowing with diffuse disc osteophyte complex. No significant spinal stenosis. Foramina remain patent.   C6-C7: Degenerative vertebral disc space narrowing with diffuse disc osteophyte complex. No significant spinal stenosis. Foramina remain patent.   C7-T1: Minimal  disc bulge. No spinal stenosis. Foramina remain patent.   IMPRESSION: 1. No acute abnormality. 2. Mild for age cervical spondylosis as above without significant stenosis or overt neural impingement.  Echocardiogram (03/10/24):  1. Left ventricular ejection fraction, by estimation, is 60 to 65%. Left  ventricular ejection fraction by 3D volume is 62 %. The left ventricle has  normal function. The left ventricle has no regional wall motion  abnormalities. Left ventricular diastolic   parameters were normal.   2. Right ventricular systolic function is normal. The right ventricular  size is normal. Tricuspid regurgitation signal is inadequate for assessing  PA pressure.   3. The mitral valve is grossly normal. No evidence of mitral valve  regurgitation. No evidence of mitral stenosis.   4. The aortic valve is tricuspid. Aortic valve regurgitation is mild.  Aortic valve sclerosis is present, with no evidence of aortic valve  stenosis.   5. The inferior vena cava is normal in size with greater than 50%  respiratory variability, suggesting right atrial pressure of 3 mmHg.   6. Cannot exclude a small PFO.   7. Negative bubble study for right to left interatrial shunt. Cannot  exclude a small left to right shunt.  LA normal in size.  Assessment/Plan:  Brittney Park is a 63 y.o. female who presents for evaluation of transient left arm heaviness. She has a relevant medical history of HTN, HLD, and pre-DM. Her neurological examination is normal today. Available diagnostic data is significant for [facility] with no acute process, CTA with no significant stenosis, and MRI brain with no acute stroke. The etiology of patient's symptoms is unclear. TIA is certainly on the differential. It is odd that the heaviness occurred while working with the arm. This could suggest peripheral nerve etiology such as radiculopathy or neuropathy. Her BP was also elevated in ED to 170s. This could suggest hypertensive  urgency/emergency as another possible etiology or the high BP could have also been a response to poor cerebral blood flow. In any case, it is safest to treat as TIA. She is s/p 21 days of DAPT and now on aspirin  monotherapy and atorvastatin  as indicated for secondary stroke prevention.  PLAN: -Continue aspirin  81 mg daily -Continue atorvastatin  40 mg daily -Stroke precautions discussed  -Return to clinic as needed  The impression above  as well as the plan as outlined below were extensively discussed with the patient who voiced understanding. All questions were answered to their satisfaction.  When available, results of the above investigations and possible further recommendations will be communicated to the patient via telephone/MyChart. Patient to call office if not contacted after expected testing turnaround time.   Total time spent reviewing records, interview, history/exam, documentation, and coordination of care on day of encounter:  60 min   Thank you for allowing me to participate in patient's care.  If I can answer any additional questions, I would be pleased to do so.  Venetia Potters, MD   CC: Corwin Antu, FNP [address] [place] [address]  CC: Referring provider: Corwin Antu, FNP [address] [place],  [place] [address]

## 2024-04-26 ENCOUNTER — Encounter: Payer: Self-pay | Admitting: Family

## 2024-04-26 ENCOUNTER — Ambulatory Visit: Admitting: Family

## 2024-04-26 VITALS — BP 144/84 | HR 79 | Temp 98.7°F | Wt 177.6 lb

## 2024-04-26 DIAGNOSIS — E781 Pure hyperglyceridemia: Secondary | ICD-10-CM | POA: Diagnosis not present

## 2024-04-26 DIAGNOSIS — Z13220 Encounter for screening for lipoid disorders: Secondary | ICD-10-CM | POA: Diagnosis not present

## 2024-04-26 DIAGNOSIS — I10 Essential (primary) hypertension: Secondary | ICD-10-CM | POA: Diagnosis not present

## 2024-04-26 DIAGNOSIS — J029 Acute pharyngitis, unspecified: Secondary | ICD-10-CM

## 2024-04-26 DIAGNOSIS — Z1231 Encounter for screening mammogram for malignant neoplasm of breast: Secondary | ICD-10-CM

## 2024-04-26 LAB — POCT RAPID STREP A (OFFICE): Rapid Strep A Screen: NEGATIVE

## 2024-04-26 MED ORDER — BLOOD PRESSURE CUFF MISC: 1.0000 | Freq: Every day | 0 refills | Status: AC

## 2024-04-26 MED ORDER — ATORVASTATIN CALCIUM 40 MG PO TABS: 40.0000 mg | ORAL_TABLET | Freq: Every day | ORAL | 3 refills | Status: AC

## 2024-04-26 MED ORDER — LOSARTAN POTASSIUM 50 MG PO TABS: 50.0000 mg | ORAL_TABLET | Freq: Every day | ORAL | 1 refills | Status: DC

## 2024-04-26 NOTE — Progress Notes (Unsigned)
 Established Patient Office Visit  Subjective:      CC:  Chief Complaint  Patient presents with   Follow-up    6 week follow up    HPI: Brittney Park is a 63 y.o. female presenting on 04/26/2024 for Follow-up (6 week follow up) .  Discussed the use of AI scribe software for clinical note transcription with the patient, who gave verbal consent to proceed.  History of Present Illness Brittney Park is a 63 year old female with hypertension and a history of TIA who presents for blood pressure management and potential sinus issues.  She experiences nasal congestion and drainage, feeling 'sinusy', with her husband noting a nasal quality to her voice. She had a scratchy throat a few days ago but currently denies ear pain or sore throat. She has a persistent cough but does not feel generally unwell.  She monitors her blood pressure at home, with readings ranging from 119/80 to 153/88, averaging around 135 to 140 over 80. She is currently taking aspirin . She reports occasional headaches.  She has a history of a transient ischemic attack (TIA) and was previously prescribed aspirin , Plavix , and atorvastatin . She completed the Plavix  course but did not continue atorvastatin  due to lack of refills. She recalls being on atorvastatin  40 mg without adverse effects.  She has a family history of hypertension, though details are unclear. She denies smoking and has no history of diabetes.      Social history:  Relevant past medical, surgical, family and social history reviewed and updated as indicated. Interim medical history since our last visit reviewed.  Allergies and medications reviewed and updated.  DATA REVIEWED: CHART IN EPIC     ROS: Negative unless specifically indicated above in HPI.    Current Outpatient Medications:    Ascorbic Acid 500 MG CHEW, Chew 2 each by mouth in the morning., Disp: , Rfl:    aspirin  EC 81 MG tablet, Take 1 tablet (81 mg total) by mouth daily. Swallow  whole., Disp: 30 tablet, Rfl: 12   atorvastatin  (LIPITOR) 40 MG tablet, Take 1 tablet (40 mg total) by mouth daily., Disp: 90 tablet, Rfl: 3   Blood Pressure Monitoring (BLOOD PRESSURE CUFF) MISC, 1 each by Does not apply route daily., Disp: 1 each, Rfl: 0   Cholecalciferol 25 MCG (1000 UT) CHEW, Chew 1 each by mouth in the morning., Disp: , Rfl:    losartan  (COZAAR ) 50 MG tablet, Take 1 tablet (50 mg total) by mouth daily., Disp: 30 tablet, Rfl: 1        Objective:        BP (!) 144/84 (BP Location: Left Arm, Patient Position: Sitting, Cuff Size: Normal)   Pulse 79   Temp 98.7 F (37.1 C) (Temporal)   Wt 177 lb 9.6 oz (80.6 kg)   LMP 12/29/2012   SpO2 98%   BMI 28.67 kg/m   Physical Exam HEENT: Pharynx slightly erythematous. NECK: Cervical lymphadenopathy present.  Wt Readings from Last 3 Encounters:  04/26/24 177 lb 9.6 oz (80.6 kg)  04/17/24 178 lb 6.4 oz (80.9 kg)  03/15/24 174 lb 9.6 oz (79.2 kg)    Physical Exam Constitutional:      General: She is not in acute distress.    Appearance: Normal appearance. She is normal weight. She is not ill-appearing, toxic-appearing or diaphoretic.  HENT:     Head: Normocephalic.     Right Ear: Tympanic membrane normal.     Left Ear: Tympanic membrane  normal.     Nose: Nose normal.     Mouth/Throat:     Mouth: Mucous membranes are dry.     Pharynx: Posterior oropharyngeal erythema and postnasal drip present. No oropharyngeal exudate.  Eyes:     Extraocular Movements: Extraocular movements intact.     Pupils: Pupils are equal, round, and reactive to light.  Cardiovascular:     Rate and Rhythm: Normal rate and regular rhythm.     Pulses: Normal pulses.     Heart sounds: Normal heart sounds.  Pulmonary:     Effort: Pulmonary effort is normal.     Breath sounds: Normal breath sounds.  Musculoskeletal:     Cervical back: Normal range of motion.  Lymphadenopathy:     Head:     Right side of head: Tonsillar adenopathy  present.     Left side of head: Tonsillar adenopathy present.     Cervical: Cervical adenopathy present.     Right cervical: Superficial cervical adenopathy present.     Left cervical: Superficial cervical adenopathy present.  Neurological:     General: No focal deficit present.     Mental Status: She is alert and oriented to person, place, and time. Mental status is at baseline.  Psychiatric:        Mood and Affect: Mood normal.        Behavior: Behavior normal.        Thought Content: Thought content normal.        Judgment: Judgment normal.          Results DIAGNOSTIC Echocardiogram: Normal cardiac output, normal systolic function, no abnormalities in the left ventricle, no left ventricular hypertrophy, aortic valve sclerosis, no stenosis, minimal aortic plaque, negative bubble study, cannot exclude a small left-to-right shunt. (02/2024) EKG: Normal (02/2024)  Assessment & Plan:   Assessment and Plan Assessment & Plan Essential hypertension Blood pressure readings range from 119/80 to 153/88, averaging 135-140/80. Current management includes aspirin . Discussed potential long-term effects of uncontrolled hypertension, including ventricular weakening and kidney irritation. Recommended starting losartan  50 mg for its kidney protective properties and to achieve target blood pressure in the 120s. Discussed potential for lifestyle modifications to reduce medication need. - Prescribed losartan  50 mg daily. - Monitor blood pressure daily, at least an hour or two after taking medication. - Encouraged lifestyle modifications, including exercise and dietary changes.  Hyperlipidemia with history of transient ischemic attack (TIA) and aortic valve sclerosis Cholesterol levels remain elevated with LDL above target of less than 70, especially concerning given TIA and aortic valve sclerosis. ASCVD risk score is 6.9%, exceeding the 5% threshold for statin therapy. Discussed importance of statin  therapy to reduce cholesterol burden and prevent further cardiovascular events. Atorvastatin  40 mg is recommended as a moderate to high-intensity statin. - Prescribed atorvastatin  40 mg daily. - Will repeat cholesterol labs at follow-up in two months.  Acute upper respiratory symptoms (cough, nasal drainage) Symptoms include nasal drainage and cough, with a scratchy throat a few days ago. No current sore throat or ear pain. Differential includes viral infection or allergies. Strep test performed to rule out bacterial infection. - Performed strep test. - Consider Xyzal for allergy symptoms.  General Health Maintenance Discussed mammogram referral and insurance coverage for a blood pressure cuff. - Ordered mammogram referral. - Sent prescription for blood pressure cuff to pharmacy.  Recording duration: 22 minutes  6.9% Risk of cardiovascular event (coronary or stroke death or non-fatal MI or stroke) in next 10 years  Return in about 2 months (around 06/27/2024) for f/u blood pressure.     Ginger Patrick, MSN, APRN, FNP-C [facility]

## 2024-04-26 NOTE — Patient Instructions (Addendum)
  ICD 10 code for hypertension is :  I10   ------------------------------------ I have sent an electronic order over to your preferred location for the following:   []   2D Mammogram  [x]   3D Mammogram  []   Bone Density   Please give this center a call to get scheduled at your convenience.  [x]   [facility]  [address] [place] [address]  [phone]  Make sure to wear two piece  clothing  No lotions powders or deodorants the day of the appointment Make sure to bring picture ID and insurance card.  Bring list of medications you are currently taking including any supplements.

## 2024-05-04 ENCOUNTER — Encounter: Payer: Self-pay | Admitting: Neurology

## 2024-05-04 ENCOUNTER — Ambulatory Visit: Admitting: Neurology

## 2024-05-04 VITALS — BP 157/99 | HR 76 | Ht 66.0 in | Wt 177.4 lb

## 2024-05-04 DIAGNOSIS — R29898 Other symptoms and signs involving the musculoskeletal system: Secondary | ICD-10-CM

## 2024-05-04 DIAGNOSIS — G459 Transient cerebral ischemic attack, unspecified: Secondary | ICD-10-CM | POA: Diagnosis not present

## 2024-05-04 NOTE — Patient Instructions (Addendum)
 I saw you today because of the sudden onset, transient left arm heaviness you had on 03/09/24. I am not sure the cause of your symptoms as fortunately your imaging was normal and you no longer have symptoms, but I agree it is safest to treat this as possible TIA.  Plan: -Continue aspirin  81 mg daily -Continue atorvastatin  40 mg daily  If you have new difficulty speaking, face droop, numbness on one side of the body, weakness on one side of the body, or dizziness/imbalance, this could be the sign of a stroke. Don't wait, please call EMS and be evaluated at the nearest emergency room.  Follow up with me as needed.  Please let me know if you have any questions or concerns in the meantime.   The physicians and staff at [facility] are committed to providing excellent care. You may receive a survey requesting feedback about your experience at our office. We strive to receive very good responses to the survey questions. If you feel that your experience would prevent you from giving the office a very good  response, please contact our office to try to remedy the situation. We may be reached at [phone]. Thank you for taking the time out of your busy day to complete the survey.  Venetia Potters, MD [facility]

## 2024-05-10 ENCOUNTER — Other Ambulatory Visit: Payer: Self-pay

## 2024-05-11 DIAGNOSIS — L821 Other seborrheic keratosis: Secondary | ICD-10-CM | POA: Diagnosis not present

## 2024-05-11 DIAGNOSIS — D1801 Hemangioma of skin and subcutaneous tissue: Secondary | ICD-10-CM | POA: Diagnosis not present

## 2024-05-18 ENCOUNTER — Other Ambulatory Visit: Payer: Self-pay | Admitting: Family

## 2024-05-18 DIAGNOSIS — I10 Essential (primary) hypertension: Secondary | ICD-10-CM

## 2024-06-01 ENCOUNTER — Other Ambulatory Visit

## 2024-06-01 ENCOUNTER — Ambulatory Visit
Admission: RE | Admit: 2024-06-01 | Discharge: 2024-06-01 | Disposition: A | Discharge status: Home / Self Care | Source: Ambulatory Visit | Attending: Family | Admitting: Family

## 2024-06-01 DIAGNOSIS — Z1231 Encounter for screening mammogram for malignant neoplasm of breast: Secondary | ICD-10-CM | POA: Diagnosis present

## 2024-06-05 ENCOUNTER — Ambulatory Visit: Payer: Self-pay | Admitting: Family

## 2024-06-06 ENCOUNTER — Other Ambulatory Visit: Payer: Self-pay

## 2024-06-07 ENCOUNTER — Other Ambulatory Visit: Payer: Self-pay

## 2024-06-27 ENCOUNTER — Ambulatory Visit: Admitting: Family

## 2024-06-29 ENCOUNTER — Other Ambulatory Visit: Payer: Self-pay

## 2024-06-30 ENCOUNTER — Other Ambulatory Visit: Payer: Self-pay

## 2024-07-03 ENCOUNTER — Ambulatory Visit: Admitting: Family
